# Patient Record
Sex: Female | Born: 1935 | Race: White | Hispanic: No | Smoking: Never smoker
Health system: Southern US, Community
[De-identification: ages and names within clinical notes are randomized; demographics above are authoritative.]

## PROBLEM LIST (undated history)

## (undated) DIAGNOSIS — E785 Hyperlipidemia, unspecified: Secondary | ICD-10-CM

## (undated) DIAGNOSIS — I10 Essential (primary) hypertension: Secondary | ICD-10-CM

## (undated) HISTORY — DX: Essential (primary) hypertension: I10

## (undated) HISTORY — DX: Hyperlipidemia, unspecified: E78.5

---

## 2012-07-31 LAB — CBC AND DIFFERENTIAL
Hemoglobin: 13.8 g/dL (ref 12.0–16.0)
WBC: 4.8 10^3/mL

## 2012-11-13 LAB — HEPATIC FUNCTION PANEL
ALT: 11 U/L (ref 7–35)
AST: 18 U/L (ref 13–35)

## 2012-11-13 LAB — TSH: TSH: 1.06 u[IU]/mL (ref 0.41–5.90)

## 2012-11-13 LAB — CBC AND DIFFERENTIAL
Platelets: 141 10*3/uL — AB (ref 150–399)
WBC: 4.8 10^3/mL

## 2013-03-21 ENCOUNTER — Encounter: Payer: Self-pay | Admitting: Family Medicine

## 2013-03-21 ENCOUNTER — Ambulatory Visit (INDEPENDENT_AMBULATORY_CARE_PROVIDER_SITE_OTHER): Payer: Medicare Other | Admitting: Family Medicine

## 2013-03-21 VITALS — BP 166/67 | HR 66 | Wt 125.0 lb

## 2013-03-21 DIAGNOSIS — F0390 Unspecified dementia without behavioral disturbance: Secondary | ICD-10-CM | POA: Insufficient documentation

## 2013-03-21 DIAGNOSIS — F411 Generalized anxiety disorder: Secondary | ICD-10-CM

## 2013-03-21 DIAGNOSIS — I10 Essential (primary) hypertension: Secondary | ICD-10-CM

## 2013-03-21 DIAGNOSIS — G4700 Insomnia, unspecified: Secondary | ICD-10-CM | POA: Insufficient documentation

## 2013-03-21 DIAGNOSIS — E785 Hyperlipidemia, unspecified: Secondary | ICD-10-CM | POA: Insufficient documentation

## 2013-03-21 MED ORDER — LORAZEPAM 0.5 MG PO TABS: ORAL_TABLET | ORAL | Status: DC

## 2013-03-21 NOTE — Progress Notes (Signed)
CC: Angela Sloan is a 79 y.o. female is here for Establish Care   Subjective: HPI:  Pleasant 79 year old who lives alone establishing care she is leaving her former provider for not returning her phone calls  Patient complains of one month of worsening inability to get a sleep at night. She has no trouble staying asleep she gets 5 hours of sleep a night however will lie in bed for hours until she falls asleep at midnight-1:00 in the morning. She denies racing thoughts she expresses frustration or that she can not get to sleep in his feet on itself the longer she stays awake. She has tried trazodone which caused nightmares, she has been trying over-the-counter sleep aids without any improvement symptoms are described as severe in severity. She does not take naps during the day she stays active during the day with social circles. She's been taking 0.5 mg of Ativan every morning for she believes over a year she's using this for anxiety and believes it is helping. Records that were available at the time of the visit show that she has taken longer acting benzodiazepines in the past I cannot tell when she was actually taking this time of day-wise.  She denies depression nor uncontrolled anxiety, denies mental disturbance or paranoia. She states she has arthritis in her back but is pain-free thanks to acetaminophen when she lies down to go to bed. Other than above nothing makes symptoms better or worse they're present on a daily basis  She's been taking Lipitor for over a year now for hyperlipidemia she's unsure of her last cholesterol check she denies right upper quadrant pain nor myalgias  Reports a history of hypertension has been taking lisinopril 20 mg twice a day no outside blood pressures to report denies any side effects denies cough, angioedema  Reports a history of memory loss has been taking Aricept on a daily basis for at least 3 months without GI disturbance  Review of Systems - General  ROS: negative for - chills, fever, night sweats, weight gain or weight loss Ophthalmic ROS: negative for - decreased vision Psychological ROS: negative for - anxiety or depression ENT ROS: negative for - hearing change, nasal congestion, tinnitus or allergies Hematological and Lymphatic ROS: negative for - bleeding problems, bruising or swollen lymph nodes Breast ROS: negative Respiratory ROS: no cough, shortness of breath, or wheezing Cardiovascular ROS: no chest pain or dyspnea on exertion Gastrointestinal ROS: no abdominal pain, change in bowel habits, or black or bloody stools Genito-Urinary ROS: negative for - genital discharge, genital ulcers, incontinence or abnormal bleeding from genitals Musculoskeletal ROS: negative for - joint pain or muscle pain other than that described above Neurological ROS: negative for - headaches or memory loss Dermatological ROS: negative for lumps, mole changes, rash and skin lesion changes  Past Medical History  Diagnosis Date  . Hypertension   . Hyperlipidemia      History reviewed. No pertinent family history.   History  Substance Use Topics  . Smoking status: Never Smoker   . Smokeless tobacco: Not on file  . Alcohol Use: No     Objective: Filed Vitals:   03/21/13 1118  BP: 166/67  Pulse: 66    General: Alert and Oriented, No Acute Distress HEENT: Pupils equal, round, reactive to light. Conjunctivae clear.  Moist mucous membranes pharynx unremarkable Lungs: Clear to auscultation bilaterally, no wheezing/ronchi/rales.  Comfortable work of breathing. Good air movement. Cardiac: Regular rate and rhythm. Normal S1/S2.  No murmurs, rubs, nor  gallops.   Abdomen: Normal bowel sounds, soft and non tender without palpable masses. Extremities: No peripheral edema.  Strong peripheral pulses.  Mental Status: No depression, anxiety, nor agitation. Skin: Warm and dry.  Assessment & Plan: Angela Sloan was seen today for establish care.  Diagnoses  and associated orders for this visit:  Hyperlipidemia  Dementia  Essential hypertension, benign  Generalized anxiety disorder  Insomnia  Other Orders - LORazepam (ATIVAN) 0.5 MG tablet; One in the morning for anxiety control, take two every evening at bedtime for sleep.    Hyperlipidemia: Requesting outside records to review prior cholesterol panels Dementia: Stable continue Aricept Essential hypertension: Uncontrolled discussed diet and exercise interventions will recheck at followup visit in one week we'll adjust medications as needed Generalized anxiety disorder: Questionably controlled given her trouble sleeping we'll continue a.m. dose of alprazolam Insomnia: Uncontrolled encouraged take 1 mg alprazolam every evening after she has gotten into bed she has tolerated this during the daytime  45 minutes spent face-to-face during visit today of which at least 50% was counseling or coordinating care regarding hyperlipidemia, dementia, essential hypertension, insomnia, generalized anxiety disorder.   Return in about 1 week (around 03/28/2013).

## 2013-03-28 ENCOUNTER — Encounter: Payer: Self-pay | Admitting: Family Medicine

## 2013-03-28 ENCOUNTER — Other Ambulatory Visit: Payer: Self-pay | Admitting: Family Medicine

## 2013-03-28 DIAGNOSIS — E538 Deficiency of other specified B group vitamins: Secondary | ICD-10-CM | POA: Insufficient documentation

## 2013-03-29 ENCOUNTER — Telehealth: Payer: Self-pay | Admitting: Family Medicine

## 2013-03-29 ENCOUNTER — Telehealth: Payer: Self-pay | Admitting: *Deleted

## 2013-03-29 ENCOUNTER — Ambulatory Visit: Payer: Medicare Other | Admitting: Family Medicine

## 2013-03-29 DIAGNOSIS — E785 Hyperlipidemia, unspecified: Secondary | ICD-10-CM

## 2013-03-29 MED ORDER — ATORVASTATIN CALCIUM 20 MG PO TABS: 20.0000 mg | ORAL_TABLET | Freq: Every day | ORAL | Status: DC

## 2013-03-29 NOTE — Telephone Encounter (Signed)
PLEASE CALL L[facility]OR [facility] FOR PATIENT.  SHE IS OUT OF MEDS AFTER TODAY.  SHE HAS APPT Monday.  THANKS

## 2013-03-29 NOTE — Addendum Note (Signed)
Addended by: Laren Boom on: 03/29/2013 11:35 AM   Modules accepted: Orders

## 2013-03-29 NOTE — Telephone Encounter (Signed)
error 

## 2013-04-01 ENCOUNTER — Ambulatory Visit: Payer: Medicare Other | Admitting: Family Medicine

## 2013-04-02 ENCOUNTER — Ambulatory Visit (INDEPENDENT_AMBULATORY_CARE_PROVIDER_SITE_OTHER): Payer: Medicare Other | Admitting: Family Medicine

## 2013-04-02 ENCOUNTER — Encounter: Payer: Self-pay | Admitting: Family Medicine

## 2013-04-02 VITALS — BP 160/69 | HR 73 | Wt 125.0 lb

## 2013-04-02 DIAGNOSIS — Z23 Encounter for immunization: Secondary | ICD-10-CM

## 2013-04-02 DIAGNOSIS — F411 Generalized anxiety disorder: Secondary | ICD-10-CM

## 2013-04-02 DIAGNOSIS — G4700 Insomnia, unspecified: Secondary | ICD-10-CM

## 2013-04-02 DIAGNOSIS — I10 Essential (primary) hypertension: Secondary | ICD-10-CM

## 2013-04-02 MED ORDER — LORAZEPAM 1 MG PO TABS: ORAL_TABLET | ORAL | Status: DC

## 2013-04-02 MED ORDER — SERTRALINE HCL 50 MG PO TABS: 50.0000 mg | ORAL_TABLET | Freq: Every day | ORAL | Status: DC

## 2013-04-02 NOTE — Progress Notes (Signed)
CC: Angela Sloan is a 79 y.o. female is here for 1 week f/u   Subjective: HPI:  Followup generalized anxiety: Patient reports continued subjective anxiety moderate in severity present on a daily basis worse when discussing other family members health issues improved with distractions such as social events at her residence. Symptoms are also slightly improved with her current regimen of alprazolam. She denies being anxious about anything in particular denies paranoia hallucinations or mental disturbance other than above. She denies depression. This has been present for over 3 months.   Followup insomnia: Patient reports slight improvement with trouble falling asleep every night of the week since starting evening dose of alprazolam. She reports waking up 2-3 hours after falling asleep with difficulty falling back asleep in the evening. She denies taking naps in the daytime. Symptoms seem to be worse when daytime anxiety is at its worse. Denies physical discomfort keeping her awake. Denies recent falls or close calls denies memory loss, confirmed by her nephew here today. Her former provider gave her a prescription of amitriptyline which she is wondering if she can start taking she does have a history of glaucoma.  Essential hypertension: She continues on lisinopril 20 mg twice a day. She's taking her blood pressure at home with readings consistently below 140/90. She denies chest pain, shortness of breath, headache, orthopnea, peripheral edema, cough or angioedema     Review Of Systems Outlined In HPI  Past Medical History  Diagnosis Date  . Hypertension   . Hyperlipidemia      Family History  Problem Relation Age of Onset  . Hypertension Brother      History  Substance Use Topics  . Smoking status: Never Smoker   . Smokeless tobacco: Not on file  . Alcohol Use: No     Objective: Filed Vitals:   04/02/13 1127  BP: 160/69  Pulse: 73    General: Alert and Oriented, No Acute  Distress HEENT: Pupils equal, round, reactive to light. Conjunctivae clear.  Moist membranes pharynx unremarkable Lungs: Clear to auscultation bilaterally, no wheezing/ronchi/rales.  Comfortable work of breathing. Good air movement. Cardiac: Regular rate and rhythm. Normal S1/S2.  No murmurs, rubs, nor gallops.   Abdomen: Soft and nontender Extremities: No peripheral edema.  Strong peripheral pulses.  Mental Status: No depression, anxiety, nor agitation. Skin: Warm and dry.  Assessment & Plan: [name] was seen today for 1 week f/u.  Diagnoses and associated orders for this visit:  Essential hypertension, benign  Generalized anxiety disorder - LORazepam (ATIVAN) 1 MG tablet; One in the morning for anxiety control, take two every evening at bedtime for sleep. - sertraline (ZOLOFT) 50 MG tablet; Take 1 tablet (50 mg total) by mouth daily.  Insomnia - LORazepam (ATIVAN) 1 MG tablet; One in the morning for anxiety control, take two every evening at bedtime for sleep. - sertraline (ZOLOFT) 50 MG tablet; Take 1 tablet (50 mg total) by mouth daily.    Essential hypertension: Uncontrolled, discussed dietary and activity interventions to help improve blood pressure control, I've asked her to bring in a detailed blood pressure diary to confirm blood pressures at home are truly in the normotensive/pre-hypertensive range. Insomnia: Uncontrolled, I've advised her against taking amitriptyline. Increasing Ativan regimen discussed fall risk which bothers her less than her discomfort with daily chronic insomnia Generalized anxiety disorder: Start Zoloft, discussed my hopes that this will decrease her subjective need for lorazepam She will receive flu shot today  Return in about 2 weeks (around 04/16/2013).

## 2013-04-12 ENCOUNTER — Telehealth: Payer: Self-pay | Admitting: *Deleted

## 2013-04-12 DIAGNOSIS — G4700 Insomnia, unspecified: Secondary | ICD-10-CM

## 2013-04-12 NOTE — Telephone Encounter (Signed)
Pt called requesting a "sleeping pill." Pt has been having trouble falling asleep at night.Please advise

## 2013-04-15 MED ORDER — RAMELTEON 8 MG PO TABS: 8.0000 mg | ORAL_TABLET | Freq: Every evening | ORAL | Status: DC | PRN

## 2013-04-15 NOTE — Telephone Encounter (Signed)
Left message on vm

## 2013-04-15 NOTE — Telephone Encounter (Signed)
Rx of ramelteon sent to [facility]

## 2013-04-16 ENCOUNTER — Telehealth: Payer: Self-pay | Admitting: *Deleted

## 2013-04-16 NOTE — Telephone Encounter (Signed)
Barrett Shell from [facility] of the [place] called and would like to know if Dr. Ivan Anchors would be ok if they did a preassesment for patient to make sure they have not overlooked anything and  to see if she can qualify for Hospice. I gave them the verbal ok to do a preassesment

## 2013-04-22 ENCOUNTER — Telehealth: Payer: Self-pay | Admitting: Family Medicine

## 2013-04-22 DIAGNOSIS — M25551 Pain in right hip: Secondary | ICD-10-CM

## 2013-04-22 NOTE — Telephone Encounter (Signed)
Opened for Care everywhere

## 2013-04-23 ENCOUNTER — Encounter: Payer: Self-pay | Admitting: Family Medicine

## 2013-04-23 ENCOUNTER — Ambulatory Visit (INDEPENDENT_AMBULATORY_CARE_PROVIDER_SITE_OTHER): Payer: Medicare Other | Admitting: Family Medicine

## 2013-04-23 VITALS — BP 119/60 | HR 66 | Wt 124.0 lb

## 2013-04-23 DIAGNOSIS — M1610 Unilateral primary osteoarthritis, unspecified hip: Secondary | ICD-10-CM

## 2013-04-23 DIAGNOSIS — M1611 Unilateral primary osteoarthritis, right hip: Secondary | ICD-10-CM

## 2013-04-23 DIAGNOSIS — I10 Essential (primary) hypertension: Secondary | ICD-10-CM

## 2013-04-23 DIAGNOSIS — M169 Osteoarthritis of hip, unspecified: Secondary | ICD-10-CM

## 2013-04-23 MED ORDER — OXYCODONE-ACETAMINOPHEN 5-325 MG PO TABS: 1.0000 | ORAL_TABLET | Freq: Four times a day (QID) | ORAL | Status: DC | PRN

## 2013-04-23 MED ORDER — MELOXICAM 7.5 MG PO TABS: 7.5000 mg | ORAL_TABLET | Freq: Every day | ORAL | Status: DC

## 2013-04-23 NOTE — Progress Notes (Signed)
CC: Angela Sloan is a 79 y.o. female is here for Hypertension   Subjective: HPI:  Follow up hypertension: Patient continues to take lisinopril 20 mg twice a day she reports blood pressures at home are consistently below 140/90. She denies lightheadedness, and balance issues, nor falls. Denies chest pain, shortness of breath, cough nor peripheral edema  Followup the emergency room visit: Over the weekend she was seen at [facility] [place] for back pain and right hip pain. X-rays were remarkable for marked periarticular osteophytes of the right with joint space narrowing along with degenerative spondylosis of the lower lumbosacral spine. She was provided a small prescription of oxycodone 5 mg she is taking one of these twice a day and has noticed a substantial improvement of her pain. She states that her quality of life is great soon after taking this medication but pain returns within 8-10 hours. Pain is localized to the right hip anteriorly nonradiating described as a stiffness moderate severity worse after periods of inactivity improves with minutes of movement to a mild degree. She denies bowel or bladder incontinence, nor motor or sensory disturbances in the lower extremities, nor saddle paresthesia   Review Of Systems Outlined In HPI  Past Medical History  Diagnosis Date  . Hypertension   . Hyperlipidemia      Family History  Problem Relation Age of Onset  . Hypertension Brother      History  Substance Use Topics  . Smoking status: Never Smoker   . Smokeless tobacco: Not on file  . Alcohol Use: No     Objective: Filed Vitals:   04/23/13 1310  BP: 119/60  Pulse: 66    General: Alert and Oriented, No Acute Distress HEENT: Pupils equal, round, reactive to light. Conjunctivae clear.  Moist mucous membranes ears unremarkable Lungs: Clear to auscultation bilaterally, no wheezing/ronchi/rales.  Comfortable work of breathing. Good air movement. Cardiac: Regular rate and  rhythm. Normal S1/S2.  No murmurs, rubs, nor gallops.   Abdomen: Soft nontender Extremities: No peripheral edema.  Strong peripheral pulses. Right hip pain reproduced with internal and external rotation of the femur, she has full range of motion and strength in both lower extremities, no lateral tenderness to the hip on the right Mental Status: No depression, anxiety, nor agitation. Skin: Warm and dry.  Assessment & Plan: Mckaylah was seen today for hypertension.  Diagnoses and associated orders for this visit:  Osteoarthritis of right hip - Discontinue: oxyCODONE-acetaminophen (ROXICET) 5-325 MG per tablet; Take 1 tablet by mouth every 6 (six) hours as needed for pain. - meloxicam (MOBIC) 7.5 MG tablet; Take 1 tablet (7.5 mg total) by mouth daily. To prevent inflammation pain from hip and knees. - oxyCODONE-acetaminophen (ROXICET) 5-325 MG per tablet; Take 1 tablet by mouth every 6 (six) hours as needed for pain. May fill on/after October 30th 2014  Essential hypertension, benign     Osteoarthritis right hip: Encouraged her to start meloxicam on a daily basis to help with chronic inflammation and that she may use oxycodone/acetaminophen on an as-needed basis, discussed constipation potential and use of stool softeners if needed Essential hypertension: Controlled continue lisinopril 20 mg twice a day  25 minutes spent face-to-face during visit today of which at least 50% was counseling or coordinating care regarding essential hypertension, osteoarthritis right hip.   Return in about 3 months (around 07/23/2013).

## 2013-04-25 ENCOUNTER — Telehealth: Payer: Self-pay | Admitting: *Deleted

## 2013-04-25 MED ORDER — ONDANSETRON HCL 8 MG PO TABS: 8.0000 mg | ORAL_TABLET | Freq: Three times a day (TID) | ORAL | Status: DC | PRN

## 2013-04-25 NOTE — Telephone Encounter (Signed)
Angela Sloan lm stating that they now think the vomiting is coming from Angela Sloan taking codeine.  He is asking if a different med can be sent without codeine.  Please advise

## 2013-04-25 NOTE — Telephone Encounter (Signed)
Patient's son called saying that the pt is vomiting & having diarrhea. He at first thought it was a med reaction but now his father is experiencing the same sx.  Can we send over something for n/v?

## 2013-04-25 NOTE — Telephone Encounter (Signed)
Pt.notified

## 2013-04-25 NOTE — Telephone Encounter (Signed)
Andrea, Rx placed in in-box ready for pickup/faxing.  

## 2013-04-26 ENCOUNTER — Telehealth: Payer: Self-pay

## 2013-04-26 MED ORDER — TRAMADOL HCL 50 MG PO TABS: 50.0000 mg | ORAL_TABLET | Freq: Three times a day (TID) | ORAL | Status: DC | PRN

## 2013-04-26 NOTE — Telephone Encounter (Signed)
[name] states Angela Sloan is allergic to codeine and would like Tramadol instead. More than 50 mg bid.

## 2013-04-26 NOTE — Telephone Encounter (Signed)
Angela Sloan, Rx placed in in-box ready for pickup/faxing.  

## 2013-04-26 NOTE — Telephone Encounter (Signed)
Pt aware and I faxed the rx to her pharm

## 2013-05-01 ENCOUNTER — Other Ambulatory Visit: Payer: Self-pay | Admitting: Family Medicine

## 2013-05-06 ENCOUNTER — Other Ambulatory Visit: Payer: Self-pay | Admitting: Family Medicine

## 2013-05-07 ENCOUNTER — Other Ambulatory Visit: Payer: Self-pay | Admitting: *Deleted

## 2013-05-07 DIAGNOSIS — G4700 Insomnia, unspecified: Secondary | ICD-10-CM

## 2013-05-07 DIAGNOSIS — F411 Generalized anxiety disorder: Secondary | ICD-10-CM

## 2013-05-07 MED ORDER — LORAZEPAM 1 MG PO TABS: ORAL_TABLET | ORAL | Status: DC

## 2013-05-08 ENCOUNTER — Encounter: Payer: Self-pay | Admitting: *Deleted

## 2013-05-08 LAB — CALCIUM: Calcium: 8.7 mg/dL

## 2013-05-08 LAB — VITAMIN B12: Vitamin B12 Deficiency Panel: >2000

## 2013-05-17 ENCOUNTER — Encounter: Payer: Self-pay | Admitting: Family Medicine

## 2013-05-17 DIAGNOSIS — Z299 Encounter for prophylactic measures, unspecified: Secondary | ICD-10-CM | POA: Insufficient documentation

## 2013-05-27 ENCOUNTER — Other Ambulatory Visit: Payer: Self-pay | Admitting: *Deleted

## 2013-05-27 MED ORDER — TRAMADOL HCL 50 MG PO TABS: 50.0000 mg | ORAL_TABLET | Freq: Three times a day (TID) | ORAL | Status: DC | PRN

## 2013-05-28 ENCOUNTER — Other Ambulatory Visit: Payer: Self-pay | Admitting: Family Medicine

## 2013-05-31 ENCOUNTER — Other Ambulatory Visit: Payer: Self-pay | Admitting: Family Medicine

## 2013-06-04 ENCOUNTER — Telehealth: Payer: Self-pay | Admitting: Family Medicine

## 2013-06-04 MED ORDER — TRAZODONE HCL 50 MG PO TABS: 50.0000 mg | ORAL_TABLET | Freq: Every evening | ORAL | Status: DC | PRN

## 2013-06-04 MED ORDER — LACRISERT 5 MG OP INST: VAGINAL_INSERT | OPHTHALMIC | Status: DC

## 2013-06-04 MED ORDER — FEXOFENADINE HCL 180 MG PO TABS: 180.0000 mg | ORAL_TABLET | Freq: Every day | ORAL | Status: DC

## 2013-06-04 NOTE — Telephone Encounter (Signed)
Faxed rx to [phone] @ [facility] [place]

## 2013-06-04 NOTE — Telephone Encounter (Signed)
Per request of [facility], Rx placed in in-box ready for pickup/faxing.

## 2013-06-25 ENCOUNTER — Other Ambulatory Visit: Payer: Self-pay | Admitting: *Deleted

## 2013-06-25 ENCOUNTER — Telehealth: Payer: Self-pay | Admitting: Family Medicine

## 2013-06-25 DIAGNOSIS — F411 Generalized anxiety disorder: Secondary | ICD-10-CM

## 2013-06-25 DIAGNOSIS — G4700 Insomnia, unspecified: Secondary | ICD-10-CM

## 2013-06-25 MED ORDER — LORAZEPAM 1 MG PO TABS: ORAL_TABLET | ORAL | Status: DC

## 2013-06-25 NOTE — Telephone Encounter (Signed)
Brother came by.  His sister needs a  new script for Lorazepam. Can you fill asap.

## 2013-06-25 NOTE — Telephone Encounter (Signed)
rx in Dr. Genelle Bal box to sign

## 2013-07-01 ENCOUNTER — Other Ambulatory Visit: Payer: Self-pay | Admitting: Family Medicine

## 2013-07-23 ENCOUNTER — Other Ambulatory Visit: Payer: Self-pay | Admitting: Family Medicine

## 2013-07-23 ENCOUNTER — Encounter: Payer: Self-pay | Admitting: Family Medicine

## 2013-07-23 ENCOUNTER — Ambulatory Visit (INDEPENDENT_AMBULATORY_CARE_PROVIDER_SITE_OTHER): Payer: Medicare Other | Admitting: Family Medicine

## 2013-07-23 VITALS — BP 216/93 | HR 82 | Wt 116.0 lb

## 2013-07-23 DIAGNOSIS — I10 Essential (primary) hypertension: Secondary | ICD-10-CM

## 2013-07-23 DIAGNOSIS — E785 Hyperlipidemia, unspecified: Secondary | ICD-10-CM

## 2013-07-23 DIAGNOSIS — M169 Osteoarthritis of hip, unspecified: Secondary | ICD-10-CM

## 2013-07-23 DIAGNOSIS — G4700 Insomnia, unspecified: Secondary | ICD-10-CM

## 2013-07-23 DIAGNOSIS — M1611 Unilateral primary osteoarthritis, right hip: Secondary | ICD-10-CM

## 2013-07-23 DIAGNOSIS — F411 Generalized anxiety disorder: Secondary | ICD-10-CM

## 2013-07-23 MED ORDER — HYDROCHLOROTHIAZIDE 25 MG PO TABS: 25.0000 mg | ORAL_TABLET | Freq: Every day | ORAL | Status: DC

## 2013-07-23 MED ORDER — TRAMADOL HCL 50 MG PO TABS: ORAL_TABLET | ORAL | Status: DC

## 2013-07-23 MED ORDER — LORAZEPAM 1 MG PO TABS: ORAL_TABLET | ORAL | Status: DC

## 2013-07-23 MED ORDER — DONEPEZIL HCL 5 MG PO TABS: 5.0000 mg | ORAL_TABLET | Freq: Every day | ORAL | Status: DC

## 2013-07-23 MED ORDER — SERTRALINE HCL 50 MG PO TABS: ORAL_TABLET | ORAL | Status: DC

## 2013-07-23 MED ORDER — ATORVASTATIN CALCIUM 20 MG PO TABS: 20.0000 mg | ORAL_TABLET | Freq: Every day | ORAL | Status: DC

## 2013-07-23 MED ORDER — LISINOPRIL 20 MG PO TABS: ORAL_TABLET | ORAL | Status: DC

## 2013-07-23 NOTE — Progress Notes (Signed)
CC: Angela Sloan is a 79 y.o. female is here for Hypertension   Subjective: HPI:  Patient presents for followup there is quite a confusion on what medication she is taking should be taking after she left [facility] after leaving for reasons the family doesn't seem to want to go into today.  The physician who discharged her had made multiple changes to her medication list but the family is unsure why this was done  Followup essential hypertension: Hydrochlorothiazide was stopped while she was in assisted living due to concerns of this causing her dry eye. She reports stopping this medication did not help her dry eyes whatsoever. Blood pressures recorded on a daily basis show gradual increase of blood pressure to her current stage II hypertensive state gradually since the beginning of December. Patient denies motor or sensory disturbances, chest pain, shortness of breath, orthopnea, peripheral edema continues on lisinopril twice a day  Followup insomnia: Patient was prescribed Rozerem but it sounds like this was never filled. She reports difficulty getting asleep at night due to pain in her right hip which is described as chronic, moderate in severity, worse with inactivity and nonradiating. Continues to take Ativan 1-2 mg every evening with mild improvement of staying asleep and getting asleep.  Generalized anxiety disorder: Continues on Zoloft however this was lowered down, only taking 25 mg on a nightly basis she is unsure why this was changed but denies any side effects. Reports that generalized anxiety is mild to moderate in severity on a daily basis described as restlessness and worrying about nothing in particular. Was much better on a higher dose of Zoloft per her report  Osteoarthritis of the right hip: She reports pain was significantly improved while taking tramadol however this was stopped for reasons she is unsure about. She has tried oxycodone and hydrocodone and codeine all of which  cause nausea.  Denies any change in the character of her right hip pain however has declined from mild to moderate in severity since stopping tramadol  Hyperlipidemia: Requesting refill on Lipitor denies right upper quadrant pain or myalgias   Review Of Systems Outlined In HPI  Past Medical History  Diagnosis Date  . Hypertension   . Hyperlipidemia      Family History  Problem Relation Age of Onset  . Hypertension Brother      History  Substance Use Topics  . Smoking status: Never Smoker   . Smokeless tobacco: Not on file  . Alcohol Use: No     Objective: Filed Vitals:   07/23/13 1328  BP: 216/93  Pulse: 82    General: Alert and Oriented, No Acute Distress HEENT: Pupils equal, round, reactive to light. Conjunctivae clear.  Moist mucous membranes pharynx unremarkable Lungs: Clear to auscultation bilaterally, no wheezing/ronchi/rales.  Comfortable work of breathing. Good air movement. Cardiac: Regular rate and rhythm. Normal S1/S2.  No murmurs, rubs, nor gallops.   Abdomen: Soft nontender Extremities: No peripheral edema.  Strong peripheral pulses.  Mental Status: No depression, anxiety, nor agitation. Skin: Warm and dry.  Assessment & Plan: [name] was seen today for hypertension.  Diagnoses and associated orders for this visit:  Essential hypertension, benign  Insomnia - LORazepam (ATIVAN) 1 MG tablet; One in the morning for anxiety control, take two every evening at bedtime for sleep.  Generalized anxiety disorder - LORazepam (ATIVAN) 1 MG tablet; One in the morning for anxiety control, take two every evening at bedtime for sleep.  Osteoarthritis of right hip  Hyperlipidemia -  atorvastatin (LIPITOR) 20 MG tablet; Take 1 tablet (20 mg total) by mouth daily. For cholesterol  Other Orders - donepezil (ARICEPT) 5 MG tablet; Take 1 tablet (5 mg total) by mouth at bedtime. To help prevent memory loss. - hydrochlorothiazide (HYDRODIURIL) 25 MG tablet; Take 1  tablet (25 mg total) by mouth daily. For blood pressure. - lisinopril (PRINIVIL,ZESTRIL) 20 MG tablet; One by mouth every morning and every evening for blood pressure. - sertraline (ZOLOFT) 50 MG tablet; TAKE 1 TABLET ONCE DAILY to help prevent anxiety - traMADol (ULTRAM) 50 MG tablet; One to one & half tabs by mouth every 8 hours only as needed for pain.    Essential hypertension: Uncontrolled restart hydrochlorothiazide keep blood pressure diary and please provide a copy of this next week to see if we need to increase up to 50 mg daily Insomnia: Uncontrolled, continue lorazepam will not increase this dosage as I feel the pain is most likely to be mostly to her insomnia. Restart tramadol below Osteoarthritis of the right hip: Restart tramadol Generalized anxiety disorder: Uncontrolled, Return to 50 mg of Zoloft on a daily basis Hyperlipidemia: Continue Lipitor return fasting in one month for general followup for lipid panel  40 minutes spent face-to-face during visit today of which at least 50% was counseling or coordinating care regarding essential hypertension, insomnia, generalized anxiety, osteoarthritis, hyperlipidemia.    Return in about 4 weeks (around 08/20/2013) for BP Check.

## 2013-07-26 ENCOUNTER — Other Ambulatory Visit: Payer: Self-pay | Admitting: Family Medicine

## 2013-08-20 ENCOUNTER — Ambulatory Visit (INDEPENDENT_AMBULATORY_CARE_PROVIDER_SITE_OTHER): Payer: Medicare Other | Admitting: Family Medicine

## 2013-08-20 ENCOUNTER — Encounter: Payer: Self-pay | Admitting: Family Medicine

## 2013-08-20 VITALS — BP 164/77 | HR 71 | Wt 113.0 lb

## 2013-08-20 DIAGNOSIS — M1610 Unilateral primary osteoarthritis, unspecified hip: Secondary | ICD-10-CM

## 2013-08-20 DIAGNOSIS — M169 Osteoarthritis of hip, unspecified: Secondary | ICD-10-CM

## 2013-08-20 DIAGNOSIS — I10 Essential (primary) hypertension: Secondary | ICD-10-CM

## 2013-08-20 DIAGNOSIS — E785 Hyperlipidemia, unspecified: Secondary | ICD-10-CM

## 2013-08-20 DIAGNOSIS — M1611 Unilateral primary osteoarthritis, right hip: Secondary | ICD-10-CM

## 2013-08-20 MED ORDER — AMLODIPINE BESYLATE 5 MG PO TABS: 5.0000 mg | ORAL_TABLET | Freq: Every day | ORAL | Status: DC

## 2013-08-20 MED ORDER — TRAMADOL HCL 50 MG PO TABS: ORAL_TABLET | ORAL | Status: DC

## 2013-08-20 NOTE — Progress Notes (Signed)
CC: Angela Sloan is a 80 y.o. female is here for Hypertension   Subjective: HPI:  Followup hypertension: Continues on lisinopril 20 mg twice a day, she was taking hydrochlorothiazide 25 mg daily however was having uncontrollable urinary frequency and urgency so she cut down to only a quarter of this on a daily basis with complete resolution of her urinary symptoms. She brings in blood pressures over the past week all of which are in high stage I hypertension. She denies chest pain shortness of breath orthopnea peripheral edema, motor sensory disturbances. Denies lightheadedness or falls since I saw her last  Followup osteoarthritis right hip: States that since taking one half tabs of Ultram twice a day symptoms are much better controlled however only for 6 hours after taking the medication then pain returns. Is described as a deep ache moderate in severity worse with movement after sitting for long periods of time.  Denies any new. Her or severity to her pain.  Followup hyperlipidemia: Continues on Lipitor without right upper quadrant pain nor myalgias. She is not fasting today   Review Of Systems Outlined In HPI  Past Medical History  Diagnosis Date  . Hypertension   . Hyperlipidemia      Family History  Problem Relation Age of Onset  . Hypertension Brother      History  Substance Use Topics  . Smoking status: Never Smoker   . Smokeless tobacco: Not on file  . Alcohol Use: No     Objective: Filed Vitals:   08/20/13 1408  BP: 164/77  Pulse: 71    General: Alert and Oriented, No Acute Distress HEENT: Pupils equal, round, reactive to light. Conjunctivae clear.  Moist mucous membranes pharynx unremarkable Lungs: Clear to auscultation bilaterally, no wheezing/ronchi/rales.  Comfortable work of breathing. Good air movement. Cardiac: Regular rate and rhythm. Normal S1/S2.  No murmurs, rubs, nor gallops.   Extremities: No peripheral edema.  Strong peripheral pulses. Full  range of motion strength of the right lower extremity without pain Mental Status: No depression, anxiety, nor agitation. Skin: Warm and dry.  Assessment & Plan: [name] was seen today for hypertension.  Diagnoses and associated orders for this visit:  Essential hypertension, benign - amLODipine (NORVASC) 5 MG tablet; Take 1 tablet (5 mg total) by mouth daily.  Hyperlipidemia  Osteoarthritis of right hip - traMADol (ULTRAM) 50 MG tablet; One to two tabs by mouth every 8 hours only as needed for pain.    Essential hypertension: Controlled chronic condition stop hydrochlorothiazide entirely continue lisinopril and now start amlodipine bring in blood pressure journal to review in 2-4 weeks Hyperlipidemia: Since she is not fasting we will wait until she can followup in a fasting state at her followup visit Osteoarthritis right hip: Continue tramadol she was advised that she can take up to 2 of these by mouth 3 times a day if needed for complete control of her pain.  Return in about 4 weeks (around 09/17/2013).

## 2013-08-20 NOTE — Patient Instructions (Signed)
Stop (water pill) hydrochlorothiazide, continue Lisinopril and now adding amlodipine all to help control blood pressure.

## 2013-08-27 ENCOUNTER — Other Ambulatory Visit: Payer: Self-pay | Admitting: Family Medicine

## 2013-08-29 ENCOUNTER — Telehealth: Payer: Self-pay | Admitting: *Deleted

## 2013-08-29 NOTE — Telephone Encounter (Signed)
Pt calling and states both legs are swollen. Pt denies any pain in the legs or anywhere else. Unfortunately we dont have any available appts tomorrow for this pt so after speaking with Dr. Ivan AnchorsHommel pt can wait until Monday or she can [facility] to [facility] if she feels the need to be seen sooner. Tried to call pt back twice and the line was busy both times

## 2013-09-04 ENCOUNTER — Other Ambulatory Visit: Payer: Self-pay | Admitting: Family Medicine

## 2013-09-05 ENCOUNTER — Telehealth: Payer: Self-pay | Admitting: Family Medicine

## 2013-09-05 NOTE — Telephone Encounter (Signed)
Denied refill request from [facility] as it was recently filled.

## 2013-09-24 ENCOUNTER — Other Ambulatory Visit: Payer: Self-pay | Admitting: *Deleted

## 2013-09-24 ENCOUNTER — Other Ambulatory Visit: Payer: Self-pay | Admitting: Family Medicine

## 2013-09-24 MED ORDER — DONEPEZIL HCL 5 MG PO TABS: 5.0000 mg | ORAL_TABLET | Freq: Every day | ORAL | Status: DC

## 2013-09-30 ENCOUNTER — Other Ambulatory Visit: Payer: Self-pay | Admitting: Family Medicine

## 2013-10-01 ENCOUNTER — Other Ambulatory Visit: Payer: Self-pay | Admitting: Family Medicine

## 2013-10-04 ENCOUNTER — Encounter: Payer: Self-pay | Admitting: Family Medicine

## 2013-10-04 ENCOUNTER — Telehealth: Payer: Self-pay | Admitting: *Deleted

## 2013-10-04 ENCOUNTER — Ambulatory Visit (INDEPENDENT_AMBULATORY_CARE_PROVIDER_SITE_OTHER): Payer: Medicare Other | Admitting: Family Medicine

## 2013-10-04 VITALS — BP 177/81 | HR 88 | Wt 106.0 lb

## 2013-10-04 DIAGNOSIS — M542 Cervicalgia: Secondary | ICD-10-CM

## 2013-10-04 DIAGNOSIS — F419 Anxiety disorder, unspecified: Secondary | ICD-10-CM

## 2013-10-04 DIAGNOSIS — F0390 Unspecified dementia without behavioral disturbance: Secondary | ICD-10-CM

## 2013-10-04 DIAGNOSIS — F411 Generalized anxiety disorder: Secondary | ICD-10-CM

## 2013-10-04 MED ORDER — CYCLOBENZAPRINE HCL 5 MG PO TABS: 2.5000 mg | ORAL_TABLET | Freq: Every evening | ORAL | Status: DC | PRN

## 2013-10-04 MED ORDER — SERTRALINE HCL 100 MG PO TABS: ORAL_TABLET | ORAL | Status: DC

## 2013-10-04 MED ORDER — AMBULATORY NON FORMULARY MEDICATION: Status: DC

## 2013-10-04 NOTE — Progress Notes (Signed)
CC: Angela Sloan is a 80 y.o. female is here for Neck Pain   Subjective: HPI:  Patient complains of neck pain this is described as stiffness moderate in severity that is present on a daily basis for the past year it has been more bothersome over the past 2 weeks. It is at the base of the skull nonradiating. It is worse when she gets nervous or anxious. He is described only as stiffness. Nothing particularly makes it better. It can occur any hours of the day.  She denies any recent or remote accident or trauma to the area pain.  She denies pain in the shoulders nor upper extremity. Denies motor or sensory disturbances in the upper extremities.  Denies difficulty swallowing. Mobic and tramadol do not seem to help.  Patient claims that her anxiety is worsening since I saw her last. Is described as a nervousness and butterflies in her stomach that occurs whenever she is around more than one person.  It often lasts for hours and will interfere with her sleep. She denies worrying about any particular event other than large groups of people. Denies depression or mental disturbance other than above.  She carries a diagnosis of dementia and has had her nephew managing her finances. She tells me today that she trusts her nephew Angela Sloan With her finances and that she believes he is looking out for her best interest. She would like for him to continue managing her finances and social security benefits. She's asking me if I can write a letter to [employer] stating this.   Review Of Systems Outlined In HPI  Past Medical History  Diagnosis Date  . Hypertension   . Hyperlipidemia     No past surgical history on file. Family History  Problem Relation Age of Onset  . Hypertension Brother     History   Social History  . Marital Status: Widowed    Spouse Name: N/A    Number of Children: N/A  . Years of Education: N/A   Occupational History  . Not on file.   Social History Main  Topics  . Smoking status: Never Smoker   . Smokeless tobacco: Not on file  . Alcohol Use: No  . Drug Use: No  . Sexual Activity: No   Other Topics Concern  . Not on file   Social History Narrative  . No narrative on file     Objective: BP 177/81  Pulse 88  Wt 106 lb (48.081 kg)  General: Alert and Oriented, No Acute Distress HEENT: Pupils equal, round, reactive to light. Conjunctivae clear.   moist mucous membranes pharynx unremarkable. No midline spinous process tenderness in the cervical spine with palpation pain is reproduced with palpation of paraspinal musculature especially directly below the occiput.full range of motion and strength of the C-spine with Spurling's negative bilaterally  Lungs: Clear to auscultation bilaterally, no wheezing/ronchi/rales.  Comfortable work of breathing. Good air movement. Cardiac: Regular rate and rhythm. Normal S1/S2.  No murmurs, rubs, nor gallops.   Extremities:  full range of motion strength in both upper extremities  Mental Status: No depression, anxiety, nor agitation. Skin: Warm and dry.  Assessment & Plan: Angela Sloan was seen today for neck pain.  Diagnoses and associated orders for this visit:  Neck pain - cyclobenzaprine (FLEXERIL) 5 MG tablet; Take 0.5 tablets (2.5 mg total) by mouth at bedtime as needed (Neck Pain).  Anxiety - sertraline (ZOLOFT) 100 MG tablet; TAKE 1 TABLET ONCE DAILY to help prevent  anxiety  Dementia  Other Orders - AMBULATORY NON FORMULARY MEDICATION; Hospice evaluation.  Dx: Dementia    Neck pain: I believe this is coming from muscular tension, she is not getting any benefit from anti-inflammatories and per her report has tolerated muscle relaxers in the past without sedation or balance issues. We'll start extremely small dose of cyclobenzaprine with sedation warning discussed Anxiety: Uncontrolled increase in Zoloft Dementia: Stable was written reflecting her desire to have her nephew continuing to  manage her finances.  Return in about 4 weeks (around 11/01/2013).

## 2013-10-04 NOTE — Telephone Encounter (Signed)
Faxed order to [facility] along with office notes and insurance card. Spoke with someone there and pt may very well qualify for hospice care now since she has lost about 6 lbs since her last visit. So you will be the attending physician for this patient meaning they will send order to you to sign and keep you updated with reports of whats going on with her. They will have physicians there who will do her symptom care and they will be the ones who will be consulted if problems arise when you during your non business hours ([phone] fax #)

## 2013-10-10 ENCOUNTER — Telehealth: Payer: Self-pay | Admitting: *Deleted

## 2013-10-10 NOTE — Telephone Encounter (Signed)
FYI: [facility]

## 2013-10-22 ENCOUNTER — Encounter: Payer: Self-pay | Admitting: Family Medicine

## 2013-10-22 ENCOUNTER — Ambulatory Visit (INDEPENDENT_AMBULATORY_CARE_PROVIDER_SITE_OTHER)

## 2013-10-22 ENCOUNTER — Ambulatory Visit (INDEPENDENT_AMBULATORY_CARE_PROVIDER_SITE_OTHER): Payer: Medicare Other | Admitting: Family Medicine

## 2013-10-22 VITALS — BP 195/88 | HR 75 | Temp 99.1°F | Wt 107.0 lb

## 2013-10-22 DIAGNOSIS — R110 Nausea: Secondary | ICD-10-CM

## 2013-10-22 DIAGNOSIS — R143 Flatulence: Secondary | ICD-10-CM

## 2013-10-22 DIAGNOSIS — M542 Cervicalgia: Secondary | ICD-10-CM

## 2013-10-22 DIAGNOSIS — F411 Generalized anxiety disorder: Secondary | ICD-10-CM

## 2013-10-22 DIAGNOSIS — R142 Eructation: Secondary | ICD-10-CM

## 2013-10-22 DIAGNOSIS — R1012 Left upper quadrant pain: Secondary | ICD-10-CM

## 2013-10-22 DIAGNOSIS — R141 Gas pain: Secondary | ICD-10-CM

## 2013-10-22 DIAGNOSIS — R911 Solitary pulmonary nodule: Secondary | ICD-10-CM

## 2013-10-22 DIAGNOSIS — F419 Anxiety disorder, unspecified: Secondary | ICD-10-CM

## 2013-10-22 LAB — CBC WITH DIFFERENTIAL/PLATELET
BASOS ABS: 0 10*3/uL (ref 0.0–0.1)
BASOS PCT: 0 % (ref 0–1)
EOS ABS: 0.1 10*3/uL (ref 0.0–0.7)
Eosinophils Relative: 2 % (ref 0–5)
HCT: 43.8 % (ref 36.0–46.0)
HEMOGLOBIN: 14.6 g/dL (ref 12.0–15.0)
Lymphocytes Relative: 18 % (ref 12–46)
Lymphs Abs: 1 10*3/uL (ref 0.7–4.0)
MCH: 30.9 pg (ref 26.0–34.0)
MCHC: 33.3 g/dL (ref 30.0–36.0)
MCV: 92.6 fL (ref 78.0–100.0)
MONOS PCT: 10 % (ref 3–12)
Monocytes Absolute: 0.6 10*3/uL (ref 0.1–1.0)
NEUTROS PCT: 70 % (ref 43–77)
Neutro Abs: 4 10*3/uL (ref 1.7–7.7)
PLATELETS: 151 10*3/uL (ref 150–400)
RBC: 4.73 MIL/uL (ref 3.87–5.11)
RDW: 15.2 % (ref 11.5–15.5)
WBC: 5.7 10*3/uL (ref 4.0–10.5)

## 2013-10-22 MED ORDER — CYCLOBENZAPRINE HCL 5 MG PO TABS
5.0000 mg | ORAL_TABLET | Freq: Every evening | ORAL | Status: DC | PRN
Start: 2013-10-22 — End: 2013-12-20

## 2013-10-22 MED ORDER — SERTRALINE HCL 100 MG PO TABS: ORAL_TABLET | ORAL | Status: DC

## 2013-10-22 MED ORDER — ONDANSETRON HCL 8 MG PO TABS: 8.0000 mg | ORAL_TABLET | Freq: Three times a day (TID) | ORAL | Status: DC | PRN

## 2013-10-22 NOTE — Progress Notes (Signed)
CC: Angela Sloan is a 80 y.o. female is here for Nausea   Subjective: HPI:  Patient complains of nausea that began yesterday following one day of diarrhea which is now resolved. Nausea is described as moderate in severity she is tolerating Gatorade not trying to eat or drink anything else. She has not vomited during this ordeal. Symptoms came on gradually nothing particularly makes better or worse. They're present all hours of the day there been no interventions as of yet. She continues to pass gas and denies any melena or blood in stool. Denies fevers, chills, chest pain, shortness of breath, diaphoresis, pelvic pain. She does endorse abdominal pain but part of her described.  Complaints of continued posterior neck pain but has not responded to 2.5 mg of cyclobenzaprine at night. She states that the medication absolutely does not cause her any dizziness, confusion, nor balance issues.  She has run out of her Zoloft maturely as she was taking this twice a day by accident.   Review Of Systems Outlined In HPI  Past Medical History  Diagnosis Date  . Hypertension   . Hyperlipidemia     No past surgical history on file. Family History  Problem Relation Age of Onset  . Hypertension Brother     History   Social History  . Marital Status: Widowed    Spouse Name: N/A    Number of Children: N/A  . Years of Education: N/A   Occupational History  . Not on file.   Social History Main Topics  . Smoking status: Never Smoker   . Smokeless tobacco: Not on file  . Alcohol Use: No  . Drug Use: No  . Sexual Activity: No   Other Topics Concern  . Not on file   Social History Narrative  . No narrative on file     Objective: BP 195/88  Pulse 75  Temp(Src) 99.1 F (37.3 C) (Oral)  Wt 107 lb (48.535 kg)  General: Alert and Oriented, No Acute Distress HEENT: Pupils equal, round, reactive to light. Conjunctivae clear.  Moist mucous membranes pharynx unremarkable Lungs: Clear to  auscultation bilaterally, no wheezing/ronchi/rales.  Comfortable work of breathing. Good air movement. Cardiac: Regular rate and rhythm. Normal S1/S2.  No murmurs, rubs, nor gallops.   Abdomen: Normal bowel sounds without guarding nor rigidity, mild left upper quadrant tenderness to palpation reproducing her subjective abdominal pain without rebound. Extremities: No peripheral edema.  Strong peripheral pulses.  Mental Status: No depression, anxiety, nor agitation. Skin: Warm and dry.  Assessment & Plan: Angela Sloan was seen today for nausea.  Diagnoses and associated orders for this visit:  Nausea alone - COMPLETE METABOLIC PANEL WITH GFR - CBC w/Diff - DG Abd Acute W/Chest; Future - PR ELECTROCARDIOGRAM, COMPLETE - ondansetron (ZOFRAN) 8 MG tablet; Take 1 tablet (8 mg total) by mouth every 8 (eight) hours as needed for nausea or vomiting.  Abdominal pain, left upper quadrant - COMPLETE METABOLIC PANEL WITH GFR - CBC w/Diff - DG Abd Acute W/Chest; Future - PR ELECTROCARDIOGRAM, COMPLETE  Neck pain - cyclobenzaprine (FLEXERIL) 5 MG tablet; Take 1 tablet (5 mg total) by mouth at bedtime as needed (Neck Pain).  Anxiety - sertraline (ZOLOFT) 100 MG tablet; TAKE 1 TABLET ONCE DAILY to help prevent anxiety    Nausea with abdominal pain: In office she was given 8 mg of ondansetron and had a drastic improvement with her nausea. While we were waiting for medication to take effect an abdominal x-ray was obtained which ruled  out obstruction without any intra-abdominal abnormality. Checking CBC with differential and metabolic panel, pending these results continue again to try and 8 mg as needed for nausea. EKG was obtained to rule out ACS showing normal sinus rhythm normal axis normal intervals, no pathologic Q waves, no ST segment elevation or depression no abdominal T waves Neck pain: Uncontrolled increase her Flexeril, side effect warning was stressed Anxiety: Sending a new refill of Zoloft to  her pharmacy since she ran out of medication prematurely, recommend she only take 100 mg once a day  Return in about 4 weeks (around 11/19/2013).

## 2013-10-23 LAB — COMPLETE METABOLIC PANEL WITH GFR
ALK PHOS: 67 U/L (ref 39–117)
ALT: <8 U/L (ref 0–35)
AST: 13 U/L (ref 0–37)
Albumin: 3.7 g/dL (ref 3.5–5.2)
BILIRUBIN TOTAL: 0.4 mg/dL (ref 0.2–1.2)
BUN: 15 mg/dL (ref 6–23)
CO2: 29 mEq/L (ref 19–32)
CREATININE: 0.67 mg/dL (ref 0.50–1.10)
Calcium: 8.7 mg/dL (ref 8.4–10.5)
Chloride: 101 mEq/L (ref 96–112)
GFR, EST AFRICAN AMERICAN: 85 mL/min
GFR, Est Non African American: 74 mL/min
GLUCOSE: 103 mg/dL — AB (ref 70–99)
Potassium: 3.7 mEq/L (ref 3.5–5.3)
SODIUM: 138 meq/L (ref 135–145)
TOTAL PROTEIN: 6 g/dL (ref 6.0–8.3)

## 2013-10-24 ENCOUNTER — Encounter: Payer: Self-pay | Admitting: *Deleted

## 2013-11-04 ENCOUNTER — Telehealth: Payer: Self-pay | Admitting: Family Medicine

## 2013-11-04 ENCOUNTER — Telehealth: Payer: Self-pay | Admitting: *Deleted

## 2013-11-04 DIAGNOSIS — R911 Solitary pulmonary nodule: Secondary | ICD-10-CM

## 2013-11-04 NOTE — Telephone Encounter (Signed)
Prior authorization obtained for CT Chest w/o contrast [facility].  Auth# [id number] valid from 11/04/13 to 12/03/13.  [facility] notified. Barry DienesKimberly Stephanos Fan, LPN

## 2013-11-04 NOTE — Telephone Encounter (Signed)
Called home # and no answer, called emergency contact Dorene SorrowJerry, her nephew and he was notified. ( he is the one that brings pt to her appts)

## 2013-11-04 NOTE — Telephone Encounter (Signed)
Angela Sloan, Will you please let patient or her nephew know that when the radiologist looked at her xrays from the end of March there was a nodule in her right lower lung.  He has recommended a CT scan of the chest to get a 3-D picture of this, I've placed an order for this study to be done here at [facility].  This finding was not what was causing her illness, it's just a standard follow up for these findings.

## 2013-11-06 ENCOUNTER — Other Ambulatory Visit: Payer: Self-pay | Admitting: Family Medicine

## 2013-11-18 ENCOUNTER — Other Ambulatory Visit: Payer: Self-pay | Admitting: Family Medicine

## 2013-11-19 ENCOUNTER — Ambulatory Visit (INDEPENDENT_AMBULATORY_CARE_PROVIDER_SITE_OTHER): Payer: Medicare Other

## 2013-11-19 ENCOUNTER — Encounter: Payer: Self-pay | Admitting: Family Medicine

## 2013-11-19 ENCOUNTER — Ambulatory Visit (INDEPENDENT_AMBULATORY_CARE_PROVIDER_SITE_OTHER): Payer: Medicare Other | Admitting: Family Medicine

## 2013-11-19 VITALS — BP 215/99 | HR 88 | Wt 103.0 lb

## 2013-11-19 DIAGNOSIS — R911 Solitary pulmonary nodule: Secondary | ICD-10-CM

## 2013-11-19 DIAGNOSIS — I2510 Atherosclerotic heart disease of native coronary artery without angina pectoris: Secondary | ICD-10-CM

## 2013-11-19 DIAGNOSIS — I517 Cardiomegaly: Secondary | ICD-10-CM

## 2013-11-19 DIAGNOSIS — M4310 Spondylolisthesis, site unspecified: Secondary | ICD-10-CM

## 2013-11-19 DIAGNOSIS — G4700 Insomnia, unspecified: Secondary | ICD-10-CM

## 2013-11-19 DIAGNOSIS — M542 Cervicalgia: Secondary | ICD-10-CM

## 2013-11-19 DIAGNOSIS — F411 Generalized anxiety disorder: Secondary | ICD-10-CM

## 2013-11-19 DIAGNOSIS — I10 Essential (primary) hypertension: Secondary | ICD-10-CM

## 2013-11-19 DIAGNOSIS — I700 Atherosclerosis of aorta: Secondary | ICD-10-CM

## 2013-11-19 DIAGNOSIS — M5030 Other cervical disc degeneration, unspecified cervical region: Secondary | ICD-10-CM

## 2013-11-19 MED ORDER — AMLODIPINE BESYLATE 10 MG PO TABS: 10.0000 mg | ORAL_TABLET | Freq: Every day | ORAL | Status: DC

## 2013-11-19 NOTE — Progress Notes (Signed)
CC: Angela Sloan is a 80 y.o. female is here for 1 month f/u anxiety   Subjective: HPI:  Followup lung nodule: CT scan order is active however they have not scheduled a time to have this performed. There has been no blood in sputum, cough, shortness of breath, nor any pulmonary complaints since I saw patient last.  Followup anxiety: Continues to take Zoloft 100 mg a daily basis without noted side effects. She states her anxiety and insomnia has improved since spring began currently is mild in severity and not interfering quality of life.   followup essential hypertension: Continues on lisinopril and amlodipine without missed doses are known side effects. No outside blood pressures to report. Denies chest pain, shortness of breath, orthopnea, peripheral edema nor motor or sensory disturbances.  Followup neck pain: Continues to have chronic neck pain localized at occiput it is nonradiating. Slightly improved with Ultram, nothing else makes better or worse. It's present all hours of the day. Denies upper extremity weakness or radiation of pain. Denies difficulty swallowing, fevers, chills, nor back pain elsewhere    Review Of Systems Outlined In HPI  Past Medical History  Diagnosis Date  . Hypertension   . Hyperlipidemia     No past surgical history on file. Family History  Problem Relation Age of Onset  . Hypertension Brother     History   Social History  . Marital Status: Widowed    Spouse Name: N/A    Number of Children: N/A  . Years of Education: N/A   Occupational History  . Not on file.   Social History Main Topics  . Smoking status: Never Smoker   . Smokeless tobacco: Not on file  . Alcohol Use: No  . Drug Use: No  . Sexual Activity: No   Other Topics Concern  . Not on file   Social History Narrative  . No narrative on file     Objective: BP 215/99  Pulse 88  Wt 103 lb (46.72 kg)  General: Alert and Oriented, No Acute Distress HEENT: Pupils equal,  round, reactive to light. Conjunctivae clear.  moist mucous membranes pharynx unremarkable. Pain is reproduced with palpation of trapezius insertion on the base of the occiput, there is no overlying swelling redness or skin changes. No midline spinous process tenderness in the cervical spine. Full-strength and range of motion of the C-spine  Lungs: Clear to auscultation bilaterally, no  rhonchi nor rales.  she does have trace diffuse end expiratory wheezing cleared by coughing.  Comfortable work of breathing. Good air movement. Cardiac: Regular rate and rhythm. Normal S1/S2.  No murmurs, rubs, nor gallops.   Extremities: No peripheral edema.  Strong peripheral pulses.  full range of motion strength in both upper extremities  Mental Status: No depression, anxiety, nor agitation. Skin: Warm and dry.  Assessment & Plan: Angela Sloan was seen today for 1 month f/u anxiety.  Diagnoses and associated orders for this visit:  Lung nodule  Generalized anxiety disorder  Insomnia  Essential hypertension, benign - amLODipine (NORVASC) 10 MG tablet; Take 1 tablet (10 mg total) by mouth daily.  Neck pain - DG Cervical Spine Complete; Future    Lung nodule: I directed patient and her nephew downstairs to CT suite to schedule CT scan of the chest  Neck Pain: Obtaining plain films of the C-spine given chronicity of pain and lack of improvement with nonsteroidal anti-inflammatories or muscle relaxants Essential hypertension: Uncontrolled chronic condition increase amlodipine Anxiety with insomnia: Controlled continue Zoloft and  nighttime Ativan  Followup within the next 4 weeks   Return in about 4 weeks (around 12/17/2013).

## 2013-11-20 ENCOUNTER — Telehealth: Payer: Self-pay | Admitting: Family Medicine

## 2013-11-20 DIAGNOSIS — M47812 Spondylosis without myelopathy or radiculopathy, cervical region: Secondary | ICD-10-CM | POA: Insufficient documentation

## 2013-11-20 MED ORDER — CELECOXIB 100 MG PO CAPS: 100.0000 mg | ORAL_CAPSULE | Freq: Two times a day (BID) | ORAL | Status: DC

## 2013-11-20 NOTE — Telephone Encounter (Signed)
Sue Lushndrea, Will you please let patient or her nephew know that her xray revealed degenerative also known as aging changes in her spine but no other suprises.  I'd encourage her to try celecoxib instead of meloxicam as an anti-inflammatory.  We'll try different anti-inflammatories to see which one works the best without compromising blood pressure.

## 2013-11-20 NOTE — Telephone Encounter (Signed)
Left message on jerry's vm with results

## 2013-11-27 ENCOUNTER — Telehealth: Payer: Self-pay | Admitting: Family Medicine

## 2013-11-27 NOTE — Telephone Encounter (Signed)
Angela Sloan, Can you please contact [facility] and ask if they will cover an Rx for celebrex or generic 100mg  capsule taken twice a day.  [employer] states that they will not cover this medication since they are under the impression that Hospice will cover this medication.

## 2013-11-27 NOTE — Telephone Encounter (Signed)
Hospice does not cover this medication. Spoke with someone there and she says its non formulary. They do cover mobic, naporsyn,indomethacin

## 2013-11-28 NOTE — Telephone Encounter (Signed)
Misty,  Can you please relay this information to the patient's insurance company, I suspect this may trigger a PA therefore I'll put the PA form outside your office.

## 2013-11-28 NOTE — Telephone Encounter (Signed)
Insurance company states for pharmacy to run it through insurance only. Pharmacy states they will work on it because it keeps kicking back to Hospice, Pharmacy states they will call back with issues.

## 2013-12-20 ENCOUNTER — Other Ambulatory Visit: Payer: Self-pay | Admitting: Family Medicine

## 2013-12-20 MED ORDER — MELOXICAM 15 MG PO TABS: ORAL_TABLET | ORAL | Status: DC

## 2013-12-20 NOTE — Telephone Encounter (Signed)
Pt and son notified.

## 2013-12-20 NOTE — Telephone Encounter (Signed)
Sue Lush, Will you please let patient know that her insurance would not cover celebrex.  My recommendation would be to increase meloxicam to a total of 15mg  daily.  I've sent a new Rx of this to [facility].

## 2013-12-24 ENCOUNTER — Ambulatory Visit: Payer: Medicare Other | Admitting: Family Medicine

## 2014-01-01 ENCOUNTER — Telehealth: Payer: Self-pay | Admitting: *Deleted

## 2014-01-01 NOTE — Telephone Encounter (Signed)
Nancy from [facility] the [place] called requesting an order to continue hospice care for dx malnutrition.She needed a verbal order. Gave her the ok to continue hospice. [phone]

## 2014-01-15 ENCOUNTER — Telehealth: Payer: Self-pay | Admitting: *Deleted

## 2014-01-15 ENCOUNTER — Other Ambulatory Visit: Payer: Self-pay | Admitting: Family Medicine

## 2014-01-15 NOTE — Telephone Encounter (Signed)
Angela Sloan with [facility] called and states pt has been experiencing GERD sxs. Harriett SineNancy requests a rx for omeprazole

## 2014-01-16 MED ORDER — OMEPRAZOLE 40 MG PO CPDR: DELAYED_RELEASE_CAPSULE | ORAL | Status: DC

## 2014-01-16 NOTE — Telephone Encounter (Signed)
[facility].

## 2014-01-16 NOTE — Telephone Encounter (Signed)
Notified Harriett SineNancy at [facility]

## 2014-01-22 ENCOUNTER — Other Ambulatory Visit: Payer: Self-pay | Admitting: Family Medicine

## 2014-01-28 ENCOUNTER — Ambulatory Visit: Admitting: Family Medicine

## 2014-02-13 ENCOUNTER — Telehealth: Payer: Self-pay | Admitting: Family Medicine

## 2014-02-13 NOTE — Telephone Encounter (Signed)
Bryn GullingAndrea, I've placed a [employer] - Hospice form in you inbox.  Can you please fill out any empty fields that you know the answers to and fax it back to the requesting party?

## 2014-02-14 NOTE — Telephone Encounter (Signed)
Done and faxed

## 2014-02-17 ENCOUNTER — Telehealth: Payer: Self-pay | Admitting: *Deleted

## 2014-02-17 NOTE — Telephone Encounter (Signed)
[facility] and states they received the form that we faxed verifying that Tramadol was not part of Hospice dx. They have initiated an overide in order to see if her Medicare part D will pay for the medication

## 2014-02-19 ENCOUNTER — Other Ambulatory Visit: Payer: Self-pay | Admitting: Family Medicine

## 2014-04-01 ENCOUNTER — Telehealth: Payer: Self-pay | Admitting: *Deleted

## 2014-04-01 NOTE — Telephone Encounter (Signed)
Nancy from [facility] the [place] called and states they are d/c pt from their care because she not longer qualifies.FYI

## 2014-04-04 ENCOUNTER — Telehealth: Payer: Self-pay | Admitting: *Deleted

## 2014-04-04 NOTE — Telephone Encounter (Signed)
Nancy from [facility] the [place] called and states they want an order to resume care for her because she recently had a fall. Hospice would like to continue care. I called nancy at [phone] and gave her a verbal ok

## 2014-04-09 ENCOUNTER — Telehealth: Payer: Self-pay | Admitting: *Deleted

## 2014-04-09 ENCOUNTER — Other Ambulatory Visit: Payer: Self-pay | Admitting: Family Medicine

## 2014-04-09 DIAGNOSIS — G4700 Insomnia, unspecified: Secondary | ICD-10-CM

## 2014-04-09 MED ORDER — TRAZODONE HCL 50 MG PO TABS: ORAL_TABLET | ORAL | Status: DC

## 2014-04-09 NOTE — Telephone Encounter (Signed)
Angela Sloan for hospice called and state pt has not been able to sleep for the past several days. Pt is currently on Lorazepam 2 mg. The physician there at Hospice did not want to increase her lorazepam since she recently had a fall. Please advise

## 2014-04-09 NOTE — Telephone Encounter (Signed)
Prescription for trazodone now 100-150 mg as needed at bedtime for sleep has been sent to [facility] to address uncontrolled insomnia

## 2014-04-10 NOTE — Telephone Encounter (Signed)
Spoke with Angela Sloan at [facility] and pt is going to try  of Melatonin first

## 2014-05-29 ENCOUNTER — Ambulatory Visit (INDEPENDENT_AMBULATORY_CARE_PROVIDER_SITE_OTHER): Payer: Medicare Other | Admitting: Family Medicine

## 2014-05-29 ENCOUNTER — Encounter: Payer: Self-pay | Admitting: Family Medicine

## 2014-05-29 VITALS — BP 147/76 | HR 76 | Ht 60.0 in | Wt 107.0 lb

## 2014-05-29 DIAGNOSIS — M47812 Spondylosis without myelopathy or radiculopathy, cervical region: Secondary | ICD-10-CM

## 2014-05-29 DIAGNOSIS — E785 Hyperlipidemia, unspecified: Secondary | ICD-10-CM

## 2014-05-29 DIAGNOSIS — G4700 Insomnia, unspecified: Secondary | ICD-10-CM

## 2014-05-29 DIAGNOSIS — N289 Disorder of kidney and ureter, unspecified: Secondary | ICD-10-CM

## 2014-05-29 DIAGNOSIS — F411 Generalized anxiety disorder: Secondary | ICD-10-CM

## 2014-05-29 MED ORDER — LORAZEPAM 1 MG PO TABS: ORAL_TABLET | ORAL | Status: DC

## 2014-05-29 MED ORDER — SERTRALINE HCL 100 MG PO TABS: ORAL_TABLET | ORAL | Status: DC

## 2014-05-29 MED ORDER — CYCLOBENZAPRINE HCL 5 MG PO TABS: ORAL_TABLET | ORAL | Status: DC

## 2014-05-29 MED ORDER — AMLODIPINE BESYLATE 10 MG PO TABS: ORAL_TABLET | ORAL | Status: DC

## 2014-05-29 MED ORDER — DONEPEZIL HCL 5 MG PO TABS: ORAL_TABLET | ORAL | Status: DC

## 2014-05-29 MED ORDER — TRAMADOL HCL 50 MG PO TABS: ORAL_TABLET | ORAL | Status: DC

## 2014-05-29 MED ORDER — MELOXICAM 15 MG PO TABS: 15.0000 mg | ORAL_TABLET | Freq: Every day | ORAL | Status: DC

## 2014-05-29 MED ORDER — LISINOPRIL 20 MG PO TABS: ORAL_TABLET | ORAL | Status: DC

## 2014-05-29 NOTE — Progress Notes (Signed)
CC: Angela Sloan is a 80 y.o. female is here for f/u from Hospice   Subjective: HPI:  Accompanied by POA "Angela Sloan" - Niece  Discharge last week from hospice given favorable prognosis  Follow-up anxiety: Continues to take lorazepam 1-2 mg every evening to help with sleeping. Denies any anxiety complaints.  Follow-up hyperlipidemia: Continues on atorvastatin 20 mg daily. No report of myalgias or right upper quadrant pain.  Follow-up insomnia: Using lorazepam to help with sleep. She's no longer napping during the daytime. She is also getting better sleep ever since starting melatonin when under hospice care.  Follow-up renal insufficiency: Continues to to use meloxicam as a nonsteroidal anti-inflammatory. No other renal medications.  Follow-up cervical arthritis: Continues to take meloxicam once a day and Ultram twice a day to help with pain. She states that pain is not interfere with quality of life provided she takes tramadol twice a day within 12 hours frequency. No upper extremity motor or sensory disturbances     Review Of Systems Outlined In HPI  Past Medical History  Diagnosis Date  . Hypertension   . Hyperlipidemia     No past surgical history on file. Family History  Problem Relation Age of Onset  . Hypertension Brother     History   Social History  . Marital Status: Widowed    Spouse Name: N/A    Number of Children: N/A  . Years of Education: N/A   Occupational History  . Not on file.   Social History Main Topics  . Smoking status: Never Smoker   . Smokeless tobacco: Not on file  . Alcohol Use: No  . Drug Use: No  . Sexual Activity: No   Other Topics Concern  . Not on file   Social History Narrative     Objective: BP 147/76 mmHg  Pulse 76  Ht 5' (1.524 m)  Wt 107 lb (48.535 kg)  BMI 20.90 kg/m2  General: Alert and Oriented, No Acute Distress HEENT: Pupils equal, round, reactive to light. Conjunctivae clear.  Moist mucous membranes Lungs:  Clear to auscultation bilaterally, no wheezing/ronchi/rales.  Comfortable work of breathing. Good air movement. Cardiac: Regular rate and rhythm. Normal S1/S2.  No murmurs, rubs, nor gallops.   Extremities: No peripheral edema.  Strong peripheral pulses.  Mental Status: No depression, anxiety, nor agitation. Skin: Warm and dry.  Assessment & Plan: Angela Sloan was seen today for f/u from hospice.  Diagnoses and associated orders for this visit:  Generalized anxiety disorder  Hyperlipidemia  Insomnia  Renal insufficiency - BASIC METABOLIC PANEL WITH GFR  Cervical spine degeneration  Other Orders - cyclobenzaprine (FLEXERIL) 5 MG tablet; TAKE ONE TABLET AT BEDTIME AS NEEDED FOR NECK PAIN - sertraline (ZOLOFT) 100 MG tablet; TAKE ONE TABLET DAILY TO help prevent anxiety - meloxicam (MOBIC) 15 MG tablet; Take 1 tablet (15 mg total) by mouth daily. - LORazepam (ATIVAN) 1 MG tablet; ONE TO TWO TABS BY MOUTH AS NEEDED FOR SLEEP. - amLODipine (NORVASC) 10 MG tablet; Take 1 tablet (10 mg total) by mouth daily. - lisinopril (PRINIVIL,ZESTRIL) 20 MG tablet; TAKE ONE TABLET IN THE MORNING AND IN THE EVENING FOR BLOOD PRESSURE - donepezil (ARICEPT) 5 MG tablet; TAKE ONE TABLET AT BEDTIME TO PREVENT MEMORY LOSS - traMADol (ULTRAM) 50 MG tablet; TAKE 1-2 TABLETS EVERY 8 HOURS AS NEEDED FOR PAIN    Anxiety: Control continue Zoloft and as needed lorazepam Hyperlipidemia: clinically controlled continue atorvastatin, lipid panel in the future when fasting Insomnia: Controlled continue melatonin  and lorazepam Renal insufficiency: Clinically controlled due for renal function Cervical spine arthritis: Controlled continue meloxicam pending renal function, continue tramadol    Return in about 3 months (around 08/29/2014) for BP and Pain follow up.

## 2014-05-30 LAB — BASIC METABOLIC PANEL WITH GFR
BUN: 22 mg/dL (ref 6–23)
CO2: 25 mEq/L (ref 19–32)
Calcium: 8.8 mg/dL (ref 8.4–10.5)
Chloride: 105 mEq/L (ref 96–112)
Creat: 0.82 mg/dL (ref 0.50–1.10)
GFR, EST NON AFRICAN AMERICAN: 60 mL/min
GFR, Est African American: 69 mL/min
Glucose, Bld: 87 mg/dL (ref 70–99)
POTASSIUM: 4.2 meq/L (ref 3.5–5.3)
Sodium: 141 mEq/L (ref 135–145)

## 2014-06-16 ENCOUNTER — Telehealth: Payer: Self-pay | Admitting: *Deleted

## 2014-06-16 NOTE — Telephone Encounter (Signed)
I'd recommend an appointment to address this.

## 2014-06-16 NOTE — Telephone Encounter (Signed)
Left voicemail to call and schedule appointment to be seen.Angela Sloan. Onica Davidovich, CMA

## 2014-06-16 NOTE — Telephone Encounter (Signed)
Angela Sloan calls that she is having bilateral ankle swelling. She denies SOB or any other symptoms just swelling. She said that she has been setting with her feet elevated and has been watching salt intake but swelling is not going down. Please advise. Corliss SkainsJamie Pinkie Manger, CMA

## 2014-06-18 NOTE — Telephone Encounter (Signed)
2nd attempt calling patient and another voicemail was left. Corliss SkainsJamie Tavarius Grewe, CMA

## 2014-06-24 ENCOUNTER — Other Ambulatory Visit: Payer: Self-pay | Admitting: Family Medicine

## 2014-07-02 ENCOUNTER — Telehealth: Payer: Self-pay | Admitting: *Deleted

## 2014-07-02 NOTE — Telephone Encounter (Signed)
Salomon FickLinda Mason called re this pt and states she called on Monday and about pt having swelling in the leg and sob and no one returned her call. I called [name] back and let her know that on the 23 of Nov we did receive her call and attempted to call her several times. Annabell Sabaldvised Linda that pt needs appt and she was transferred up front to be scheduled

## 2014-07-04 ENCOUNTER — Ambulatory Visit (INDEPENDENT_AMBULATORY_CARE_PROVIDER_SITE_OTHER): Payer: Medicare Other

## 2014-07-04 ENCOUNTER — Ambulatory Visit (INDEPENDENT_AMBULATORY_CARE_PROVIDER_SITE_OTHER): Payer: Medicare Other | Admitting: Family Medicine

## 2014-07-04 ENCOUNTER — Encounter: Payer: Self-pay | Admitting: Family Medicine

## 2014-07-04 VITALS — BP 177/91 | HR 92 | Wt 107.0 lb

## 2014-07-04 DIAGNOSIS — M25661 Stiffness of right knee, not elsewhere classified: Secondary | ICD-10-CM

## 2014-07-04 DIAGNOSIS — R062 Wheezing: Secondary | ICD-10-CM

## 2014-07-04 DIAGNOSIS — M25561 Pain in right knee: Secondary | ICD-10-CM

## 2014-07-04 DIAGNOSIS — M25473 Effusion, unspecified ankle: Secondary | ICD-10-CM

## 2014-07-04 DIAGNOSIS — M25551 Pain in right hip: Secondary | ICD-10-CM

## 2014-07-04 MED ORDER — ALBUTEROL SULFATE 108 (90 BASE) MCG/ACT IN AEPB: 2.0000 | INHALATION_SPRAY | RESPIRATORY_TRACT | Status: DC | PRN

## 2014-07-04 NOTE — Progress Notes (Signed)
CC: Angela Sloan is a 80 y.o. female is here for Leg Swelling   Subjective: HPI:   accompanied by Angela Sloan   patient complains of bilateral ankle swelling. She states it does not bother her psychologically or physically but other people have been mentioning it to her. It is present all hours of the day worse if she is on her feet. It is improved with elevating her feet for at least an hour. It does not seem to be getting better or worse. She noticed it a few weeks ago. She denies any orthopnea or peripheral edema elsewhere or PND. She denies any exertional chest pain or chest pain at rest. She denies any irregular heartbeat or rapid heartbeat.  On review of systems she reports that she does get short of breath when talking fast but not with exerting herself. She denies cough, wheezing, fevers or chills.  She wants to know if I can refer her to a bone specialist for her chronic hip and knee pain. She localizes the pain in the groin of the right hip nothing particularly makes it better or worse. Her right knee pain is localized"deep"in the joint and nonradiating. She is uncertain what makes it better or worse however she thinks she may have had some benefit from meloxicam. She specifically wants to have a specialist manage this   Review Of Systems Outlined In HPI  Past Medical History  Diagnosis Date  . Hypertension   . Hyperlipidemia     No past surgical history on file. Family History  Problem Relation Age of Onset  . Hypertension Brother     History   Social History  . Marital Status: Widowed    Spouse Name: N/A    Number of Children: N/A  . Years of Education: N/A   Occupational History  . Not on file.   Social History Main Topics  . Smoking status: Never Smoker   . Smokeless tobacco: Not on file  . Alcohol Use: No  . Drug Use: No  . Sexual Activity: No   Other Topics Concern  . Not on file   Social History Narrative     Objective: BP 177/91 mmHg  Pulse 92  Wt  107 lb (48.535 kg)  SpO2 94%  General: Alert and Oriented, No Acute Distress HEENT: Pupils equal, round, reactive to light. Conjunctivae clear.  Moist mucous membranes times unremarkable Lungs: comfortable work of breathing with trace end expiratory wheezing in all lung fields without rhonchi nor rales. No signs of consolidation Cardiac: Regular rate and rhythm. Normal S1/S2.  No murmurs, rubs, nor gallops.   Extremities: trace nonpitting edema of the ankles bilaterally, no edema elsewhere.  Strong peripheral pulses.  Mental Status: No depression, anxiety, nor agitation. Skin: Warm and dry.  Assessment & Plan: Angela Sloan was seen today for leg swelling.  Diagnoses and associated orders for this visit:  Right hip pain - DG Hip Complete Right; Future  Right knee pain - DG Knee 1-2 Views Right; Future  Wheezing  Ankle swelling, unspecified laterality  Other Orders - Albuterol Sulfate (PROAIR RESPICLICK) 108 (90 BASE) MCG/ACT AEPB; Inhale 2 Inhalers into the lungs every 4 (four) hours as needed (shortness of breath).    Wheezing: CT scan from earlier this year reviewed showing no chronic lung disease. Suspect she is having some reactive airway disease to some environmental irritant therefore provided with albuterol powder inhaler today. Time was taken to demonstrate how to use this and she demonstrated back to me that she  was using it correctly. Ankle swelling: Suspect venous insufficiency, discussed that this is benign if not causing pain or causing skin changes. Low suspicion of congestive heart failure. We will follow this clinically to see if we need to order an echocardiogram, discussed no need for diuretics given lack of pain and mild degree of swelling Right hip pain and right knee pain, she understandably does not ever want to pursue surgical interventions, I'm going to refer her to Dr. Karie Sloan and sports medicine for nonsurgical options since she specifically wants a specialist to manage  this. Obtaining x-rays today to make their first visit more productive.  Return for 1-2 weeks for Dr. Karie Sloan sports medicine consult.

## 2014-07-07 ENCOUNTER — Telehealth: Payer: Self-pay | Admitting: Family Medicine

## 2014-07-07 NOTE — Telephone Encounter (Signed)
Pt notified; she would like to wait until after xmas to schedule appt

## 2014-07-07 NOTE — Telephone Encounter (Signed)
Angela Sloan, Will you please let patient or her family know that her hip and knee xrays showed degenerative changes as was expected based on her pain.  I would still recommend that she visit with Dr. Karie Schwalbe for a sports medicine consult to discuss non-operative management options.

## 2014-07-09 ENCOUNTER — Other Ambulatory Visit: Payer: Self-pay | Admitting: Family Medicine

## 2014-07-11 ENCOUNTER — Encounter: Payer: Self-pay | Admitting: Sports Medicine

## 2014-07-11 ENCOUNTER — Ambulatory Visit (INDEPENDENT_AMBULATORY_CARE_PROVIDER_SITE_OTHER): Payer: Medicare Other | Admitting: Sports Medicine

## 2014-07-11 VITALS — BP 141/65 | HR 59 | Wt 109.0 lb

## 2014-07-11 DIAGNOSIS — M47812 Spondylosis without myelopathy or radiculopathy, cervical region: Secondary | ICD-10-CM

## 2014-07-11 DIAGNOSIS — M1611 Unilateral primary osteoarthritis, right hip: Secondary | ICD-10-CM

## 2014-07-11 NOTE — Assessment & Plan Note (Signed)
X-rays do show some C5 on C6 spondylolisthesis. Formal physical therapy. If no better at the return visit we will do an MRI for interventional planning.

## 2014-07-11 NOTE — Assessment & Plan Note (Signed)
X-rays confirm severe right hip osteoarthritis with pain referable to the groin. Right-sided femoral acetabular joint injection under ultrasound guidance. Physical therapy.  Return to see me in one month.

## 2014-07-11 NOTE — Progress Notes (Signed)
   Subjective:    I'm seeing this patient as a consultation for:  Dr. Ivan AnchorsHommel  CC: Right hip pain  HPI: This is a very pleasant 80 -year-old female, she comes in with a one year history of pain that she localizes in the right groin, moderate, persistent without radiation, worse with weightbearing.  She has not responded to oral NSAIDs.  Neck pain: Amenable to discussed this at a future visit however pain is worse in the posterior aspect of the neck and worse with extension.  Past medical history, Surgical history, Family history not pertinant except as noted below, Social history, Allergies, and medications have been entered into the medical record, reviewed, and no changes needed.   Review of Systems: No headache, visual changes, nausea, vomiting, diarrhea, constipation, dizziness, abdominal pain, skin rash, fevers, chills, night sweats, weight loss, swollen lymph nodes, body aches, joint swelling, muscle aches, chest pain, shortness of breath, mood changes, visual or auditory hallucinations.   Objective:   General: Well Developed, well nourished, and in no acute distress.  Neuro/Psych: Alert and oriented x3, extra-ocular muscles intact, able to move all 4 extremities, sensation grossly intact. Skin: Warm and dry, no rashes noted.  Respiratory: Not using accessory muscles, speaking in full sentences, trachea midline.  Cardiovascular: Pulses palpable, no extremity edema. Abdomen: Does not appear distended. Right Hip: ROM IR: 60 Deg, ER: 60 Deg, Flexion: 120 Deg, Extension: 100 Deg, Abduction: 45 Deg, Adduction: 45 Deg there is a reproduction of groin pain with internal rotation. Strength IR: 5/5, ER: 5/5, Flexion: 5/5, Extension: 5/5, Abduction: 5/5, Adduction: 5/5 Pelvic alignment unremarkable to inspection and palpation. Standing hip rotation and gait without trendelenburg / unsteadiness. Greater trochanter without tenderness to palpation. No tenderness over piriformis. No SI joint  tenderness and normal minimal SI movement.  Procedure: Real-time Ultrasound Guided Injection of right femoral acetabular joint Device: GE Logiq E  Verbal informed consent obtained.  Time-out conducted.  Noted no overlying erythema, induration, or other signs of local infection.  Skin prepped in a sterile fashion.  Local anesthesia: Topical Ethyl chloride.  With sterile technique and under real time ultrasound guidance:  Noted mild to moderate joint effusion, spinal needle advanced into the femoral head/neck junction, contacted bone and withdrawn slightly, 2 mL kenalog 40, 4 mL lidocaine then injected easily. Completed without difficulty  Pain immediately resolved suggesting accurate placement of the medication.  Advised to call if fevers/chills, erythema, induration, drainage, or persistent bleeding.  Images permanently stored and available for review in the ultrasound unit.  Impression: Technically successful ultrasound guided injection.  Impression and Recommendations:   This case required medical decision making of moderate complexity.

## 2014-07-22 ENCOUNTER — Ambulatory Visit (INDEPENDENT_AMBULATORY_CARE_PROVIDER_SITE_OTHER): Payer: Medicare Other | Admitting: Physical Therapy

## 2014-07-22 DIAGNOSIS — M6281 Muscle weakness (generalized): Secondary | ICD-10-CM

## 2014-07-22 DIAGNOSIS — R293 Abnormal posture: Secondary | ICD-10-CM

## 2014-07-22 DIAGNOSIS — M2550 Pain in unspecified joint: Secondary | ICD-10-CM

## 2014-07-22 DIAGNOSIS — M47812 Spondylosis without myelopathy or radiculopathy, cervical region: Secondary | ICD-10-CM

## 2014-07-22 DIAGNOSIS — M1611 Unilateral primary osteoarthritis, right hip: Secondary | ICD-10-CM

## 2014-07-30 ENCOUNTER — Encounter: Payer: Medicare Other | Admitting: Physical Therapy

## 2014-07-30 ENCOUNTER — Other Ambulatory Visit: Payer: Self-pay | Admitting: Family Medicine

## 2014-08-11 ENCOUNTER — Ambulatory Visit (INDEPENDENT_AMBULATORY_CARE_PROVIDER_SITE_OTHER): Payer: Medicare Other | Admitting: Family Medicine

## 2014-08-11 ENCOUNTER — Encounter: Payer: Self-pay | Admitting: Family Medicine

## 2014-08-11 ENCOUNTER — Telehealth: Payer: Self-pay | Admitting: Family Medicine

## 2014-08-11 VITALS — BP 148/77 | HR 84 | Wt 107.0 lb

## 2014-08-11 DIAGNOSIS — M47812 Spondylosis without myelopathy or radiculopathy, cervical region: Secondary | ICD-10-CM

## 2014-08-11 MED ORDER — PREDNISONE 20 MG PO TABS: ORAL_TABLET | ORAL | Status: AC

## 2014-08-11 NOTE — Telephone Encounter (Signed)
Sue Lushndrea, Can you see if [facility] has any imaging results for this patient over the past ten years?  One of her family members was told that in 2010 she had imaging of her head or neck that showed an aneurysm and I'm trying to clarify if this is true.  She may have been seeing this clinic [facility]"

## 2014-08-11 NOTE — Progress Notes (Signed)
CC: Angela Sloan is a 81 y.o. female is here for Neck Pain   Subjective: HPI:  Complains of worsening neck pain described as a constant ache localized in the posterior neck in the midline that radiates up into the back of the head. Slightly improved with tramadol or Goody powder, nothing particularly makes it worse. Is accompanied by nausea, the worse her pain is the more nauseous she feels. She denies any recent fall or injury since I saw her last. Denies any accompanying motor or sensory disturbances with the above chief complaint. She was referred for physical therapy however felt that the therapy was to physically taxing and decided not to follow-up with future appointments. Denies throat pain, dysphagia, abdominal pain, confusion, vision changes, nor radiation of pain into her upper extremities.  Her family member, Angela Sloan, tells me that she was recently informed that in 2010 the patient had an MRI of her neck that showed an aneurysm which was close to be followed up on however nobody has been addressing this. Patient believes that this is true but is unable to recall any more information. She is confident that her headache or neck pain does not have any pulsatile component to the pain.   Review Of Systems Outlined In HPI  Past Medical History  Diagnosis Date  . Hypertension   . Hyperlipidemia     No past surgical history on file. Family History  Problem Relation Age of Onset  . Hypertension Brother     History   Social History  . Marital Status: Widowed    Spouse Name: N/A    Number of Children: N/A  . Years of Education: N/A   Occupational History  . Not on file.   Social History Main Topics  . Smoking status: Never Smoker   . Smokeless tobacco: Not on file  . Alcohol Use: No  . Drug Use: No  . Sexual Activity: No   Other Topics Concern  . Not on file   Social History Narrative     Objective: BP 148/77 mmHg  Pulse 84  Wt 107 lb (48.535 kg)  General: Alert  and Oriented, No Acute Distress HEENT: Pupils equal, round, reactive to light. Conjunctivae clear.  External ears unremarkable, canals clear with intact TMs with appropriate landmarks.  Middle ear appears open without effusion. Pink inferior turbinates.  Moist mucous membranes, pharynx without inflammation nor lesions.  Neck supple without palpable lymphadenopathy nor abnormal masses. Lungs: Clear to auscultation bilaterally, no wheezing/ronchi/rales.  Comfortable work of breathing. Good air movement. Cardiac: Regular rate and rhythm. Normal S1/S2.  No murmurs, rubs, nor gallops.   Neck: No midline spinous process tenderness in the cervical spine, full range of motion and strength in all 3 planes of the cervical spine Extremities: No peripheral edema.  Strong peripheral pulses.  Mental Status: No depression, anxiety, nor agitation. Skin: Warm and dry.  Assessment & Plan: Angela Sloan was seen today for neck pain.  Diagnoses and associated orders for this visit:  Cervical spondylosis without myelopathy - predniSONE (DELTASONE) 20 MG tablet; Three tabs daily days 1-3, two tabs daily days 4-6, one tab daily days 7-9, half tab daily days 10-13.    Cervical spondylosis: Worsening chronic condition, continue as needed tramadol begin prednisone taper. If no benefit by the end of the week will obtain MRI for Angela Sloan's recommendations.  [facility] known [facility] to look into the possibility of her actually having an aneurysm  in the brain or cervical region.   Return if symptoms worsen or fail to improve.

## 2014-08-13 NOTE — Telephone Encounter (Signed)
Called [facility] [phone] and [phone]. They have no record of patient being seen there

## 2014-08-20 ENCOUNTER — Other Ambulatory Visit: Payer: Self-pay | Admitting: Family Medicine

## 2014-08-25 ENCOUNTER — Other Ambulatory Visit: Payer: Self-pay | Admitting: Family Medicine

## 2014-08-25 NOTE — Telephone Encounter (Signed)
Pt is due for a f/u appt.

## 2014-08-28 ENCOUNTER — Telehealth: Payer: Self-pay

## 2014-08-28 NOTE — Telephone Encounter (Signed)
Patient seen in office

## 2014-08-28 NOTE — Telephone Encounter (Signed)
Waiting on Pa from cover my meds - CF

## 2014-08-29 ENCOUNTER — Telehealth: Payer: Self-pay

## 2014-08-29 ENCOUNTER — Ambulatory Visit (INDEPENDENT_AMBULATORY_CARE_PROVIDER_SITE_OTHER): Payer: Medicare Other | Admitting: Family Medicine

## 2014-08-29 ENCOUNTER — Encounter: Payer: Self-pay | Admitting: Family Medicine

## 2014-08-29 DIAGNOSIS — I10 Essential (primary) hypertension: Secondary | ICD-10-CM

## 2014-08-29 DIAGNOSIS — M47812 Spondylosis without myelopathy or radiculopathy, cervical region: Secondary | ICD-10-CM

## 2014-08-29 DIAGNOSIS — R0602 Shortness of breath: Secondary | ICD-10-CM

## 2014-08-29 DIAGNOSIS — J449 Chronic obstructive pulmonary disease, unspecified: Secondary | ICD-10-CM

## 2014-08-29 DIAGNOSIS — IMO0001 Reserved for inherently not codable concepts without codable children: Secondary | ICD-10-CM

## 2014-08-29 MED ORDER — LISINOPRIL-HYDROCHLOROTHIAZIDE 20-25 MG PO TABS: 1.0000 | ORAL_TABLET | Freq: Every day | ORAL | Status: DC

## 2014-08-29 MED ORDER — TIOTROPIUM BROMIDE-OLODATEROL 2.5-2.5 MCG/ACT IN AERS: 1.0000 | INHALATION_SPRAY | Freq: Every day | RESPIRATORY_TRACT | Status: DC

## 2014-08-29 MED ORDER — TRAMADOL HCL 50 MG PO TABS: ORAL_TABLET | ORAL | Status: DC

## 2014-08-29 NOTE — Progress Notes (Signed)
CC: Angela Sloan is a 81 y.o. female is here for Hypertension   Subjective: HPI:  Follow-up essential hypertension: Continues to take lisinopril twice a day. No outside blood pressures to report. There has been no chest pain nor motor or sensory disturbances. Denies any known side effects.  Follow-up cough: She continues to have shortness of breath and cough since I saw her last. It was only mild in severity and not really bothering her at her last visit however today seems to be moderate in severity. Shortest of breath is only present with activity. She denies wheezing. She's been using albuterol but does not feel like it's really helping much. She is uncertain how often she's been using the albuterol. Cough is nonproductive. She denies fevers, chills, new back pain.  Follow-up neck pain: She's been taking half of the tramadol twice a day. At this dose she still requires goody powder frequent throughout the day. I don't think she is really taking Celebrex when I directly questioned her about this. She's tried physical therapy for this pain but was unable to follow through with it. Pain is still in the back of the neck that radiates up the back of the head into the top of the skull. She denies any pain radiation into either arm. Pain is worse with extension of the neck. The pain is so bad that she often spends the majority of the day in bed because that's where she has the most minimal amount of pain. She denies any recent falls or close calls.denies dysphagia or weakness in the upper extremities   Review Of Systems Outlined In HPI  Past Medical History  Diagnosis Date  . Hypertension   . Hyperlipidemia     No past surgical history on file. Family History  Problem Relation Age of Onset  . Hypertension Brother     History   Social History  . Marital Status: Widowed    Spouse Name: N/A    Number of Children: N/A  . Years of Education: N/A   Occupational History  . Not on file.    Social History Main Topics  . Smoking status: Never Smoker   . Smokeless tobacco: Not on file  . Alcohol Use: No  . Drug Use: No  . Sexual Activity: No   Other Topics Concern  . Not on file   Social History Narrative     Objective: BP 160/77 mmHg  Pulse 109  Wt 104 lb (47.174 kg)  General: Alert and Oriented, No Acute Distress HEENT: Pupils equal, round, reactive to light. Conjunctivae clear.  Moist mucous membranes pharynx unremarkable Lungs: comfortable work of breathing with inspiratory and expiratory wheezing no rhonchi or rales. Cardiac: Regular rate and rhythm. Normal S1/S2.  No murmurs, rubs, nor gallops.   Neck: Full range of motion and strength in all 3 planes of the cervical spine. No midline spinous process tenderness. No paraspinal musculature tenderness on the left of the right with palpation. No palpable abnormality Extremities: No peripheral edema.  Strong peripheral pulses.  Mental Status: No depression, anxiety, nor agitation. Skin: Warm and dry.  Assessment & Plan: [name] was seen today for hypertension.  Diagnoses and associated orders for this visit:  Essential hypertension, benign  COPD bronchitis  SOB (shortness of breath) - CBC  Cervical spondylosis without myelopathy - MR Cervical Spine Wo Contrast; Future  Other Orders - Tiotropium Bromide-Olodaterol (STIOLTO RESPIMAT) 2.5-2.5 MCG/ACT AERS; Inhale 1 Inhaler into the lungs daily. - lisinopril-hydrochlorothiazide (PRINZIDE,ZESTORETIC) 20-25 MG per tablet;  Take 1 tablet by mouth daily. - traMADol (ULTRAM) 50 MG tablet; TAKE 1-2 TABLETS EVERY 8 HOURS AS NEEDED FOR PAIN    Essential hypertension: Uncontrolled chronic condition adding hydrochlorothiazide to lisinopril COPD: advised to stop albuterol and switch to Stiolto. Time was taken to demonstrate how to use this product, both her niece and herself were able to teach back often to use this device and how to properly use it. She took a  single dose here in the clinic. Cervical spondylosis: encourage her to increase her tramadol to up to 100 mg 3 times a day. Given persistent pain we'll obtain MRI in to see if she is a candidate for steroid injections. For her shortness of breath and still like to get her hemoglobin checked today to make sure anemia is not contributing to her shortness of breath along with COPD.  40 minutes spent face-to-face during visit today of which at least 50% was counseling or coordinating care regarding: 1. Essential hypertension, benign   2. COPD bronchitis   3. SOB (shortness of breath)   4. Cervical spondylosis without myelopathy      Return in about 4 weeks (around 09/26/2014) for Breathing and Neck.

## 2014-08-29 NOTE — Telephone Encounter (Signed)
PA required for MRI cervical spinn without contrast - Approved [id number] good until 09/27/2014

## 2014-08-30 LAB — CBC
HEMATOCRIT: 46.1 % — AB (ref 36.0–46.0)
HEMOGLOBIN: 15.4 g/dL — AB (ref 12.0–15.0)
MCH: 30.3 pg (ref 26.0–34.0)
MCHC: 33.4 g/dL (ref 30.0–36.0)
MCV: 90.6 fL (ref 78.0–100.0)
MPV: 10.5 fL (ref 8.6–12.4)
Platelets: 207 10*3/uL (ref 150–400)
RBC: 5.09 MIL/uL (ref 3.87–5.11)
RDW: 14.7 % (ref 11.5–15.5)
WBC: 7.7 10*3/uL (ref 4.0–10.5)

## 2014-09-04 ENCOUNTER — Telehealth: Payer: Self-pay

## 2014-09-04 NOTE — Telephone Encounter (Signed)
Received auth for Lorazepam it expires on 08/29/2015 - CF

## 2014-09-08 ENCOUNTER — Other Ambulatory Visit: Payer: Medicare Other

## 2014-09-15 ENCOUNTER — Ambulatory Visit (INDEPENDENT_AMBULATORY_CARE_PROVIDER_SITE_OTHER): Payer: Medicare Other

## 2014-09-15 DIAGNOSIS — M2578 Osteophyte, vertebrae: Secondary | ICD-10-CM

## 2014-09-15 DIAGNOSIS — M4312 Spondylolisthesis, cervical region: Secondary | ICD-10-CM

## 2014-09-15 DIAGNOSIS — M47812 Spondylosis without myelopathy or radiculopathy, cervical region: Secondary | ICD-10-CM

## 2014-09-15 DIAGNOSIS — M4802 Spinal stenosis, cervical region: Secondary | ICD-10-CM

## 2014-09-19 ENCOUNTER — Encounter: Payer: Self-pay | Admitting: Sports Medicine

## 2014-09-19 ENCOUNTER — Ambulatory Visit (INDEPENDENT_AMBULATORY_CARE_PROVIDER_SITE_OTHER): Payer: Medicare Other | Admitting: Sports Medicine

## 2014-09-19 DIAGNOSIS — M47812 Spondylosis without myelopathy or radiculopathy, cervical region: Secondary | ICD-10-CM

## 2014-09-19 MED ORDER — KETOROLAC TROMETHAMINE 30 MG/ML IJ SOLN
30.0000 mg | Freq: Once | INTRAMUSCULAR | Status: AC
  Administered 2014-09-19: 30 mg via INTRAMUSCULAR

## 2014-09-19 MED ORDER — HYDROCODONE-ACETAMINOPHEN 5-325 MG PO TABS: 1.0000 | ORAL_TABLET | Freq: Three times a day (TID) | ORAL | Status: DC | PRN

## 2014-09-19 NOTE — Progress Notes (Signed)
   Subjective:    I'm seeing this patient as a consultation for:  Dr. Laren BoomSean Hommel  CC:  "neck pain"  HPI: Patient presents with complaint of 8 months of worsening neck pain. The pain is "100/10", constant, dull, begins at the sides of her neck (points to SCMs bilaterally) and radiates backward toward the center of her neck and to head and eyes. She has been taking tramadol which has offered only partial relief. The pain is not exacerbated by anything in particular. Her neck feels stiff and she has been unable to tolerate physical therapy. She denies any numbness or weakness in her upper extremities. She had an MRI 09/15/14 cervical spine which showed degenerative disease with foraminal narrowing with stenosis at multiple levels, anterolisthesis with osteophyte complexes at C5-6, C6-7.   Past medical history, Surgical history, Family history not pertinant except as noted below, Social history, Allergies, and medications have been entered into the medical record, reviewed, and no changes needed.   Review of Systems: No headache, visual changes, nausea, vomiting, diarrhea, constipation, dizziness, abdominal pain, skin rash, fevers, chills, night sweats, weight loss, swollen lymph nodes, body aches, joint swelling, muscle aches, chest pain, shortness of breath, mood changes, visual or auditory hallucinations.   Objective:   General: Well Developed, well nourished, and in no acute distress.  Neuro/Psych: Alert and oriented x3, extra-ocular muscles intact, able to move all 4 extremities, sensation grossly intact. Skin: Warm and dry, no rashes noted.  Respiratory: Not using accessory muscles, speaking in full sentences, trachea midline.  Cardiovascular: Pulses palpable, no extremity edema. Abdomen: Does not appear distended. MSK: No spinal tenderness to palpation, however palpation of cervical and shoulder musculature did reproduce pain.    Impression and Recommendations:   This case required  medical decision making of moderate complexity.  # Cervical Spondylosis without Myelopathy - Current symptoms do not localize to a specific vertebral level and are unlikely to represent a radiculopathy, but instead likely due to diffuse muscle spasm. - Toradol 30 mg IM given in the office now - Continue Tramadol 50 mg Q4H prn pain - Hydrocodone/APAP 5/325 Q8H prn pain - Patient referred for Home health PT for topical modalities  Follow up in 3 weeks or sooner if needed

## 2014-09-19 NOTE — Assessment & Plan Note (Signed)
Progressive neck pain, with multilevel cervical spondylosis and some spondylolisthesis, symptoms are predominantly myofascial. She has no radicular symptoms, and no signs of myelopathy. Toradol 30 mg intramuscular, home health physical therapy for predominantly topical modalities, and I am going to give her a bit of hydrocodone. Return to see me in 2-3 weeks to see how things are going.

## 2014-09-24 NOTE — Telephone Encounter (Signed)
Opened in error

## 2014-09-29 ENCOUNTER — Telehealth: Payer: Self-pay

## 2014-09-29 ENCOUNTER — Ambulatory Visit (INDEPENDENT_AMBULATORY_CARE_PROVIDER_SITE_OTHER): Payer: Medicare Other | Admitting: Family Medicine

## 2014-09-29 ENCOUNTER — Encounter: Payer: Self-pay | Admitting: Family Medicine

## 2014-09-29 VITALS — BP 164/82 | HR 99 | Wt 106.0 lb

## 2014-09-29 DIAGNOSIS — I10 Essential (primary) hypertension: Secondary | ICD-10-CM

## 2014-09-29 DIAGNOSIS — R443 Hallucinations, unspecified: Secondary | ICD-10-CM

## 2014-09-29 DIAGNOSIS — IMO0001 Reserved for inherently not codable concepts without codable children: Secondary | ICD-10-CM

## 2014-09-29 DIAGNOSIS — J449 Chronic obstructive pulmonary disease, unspecified: Secondary | ICD-10-CM

## 2014-09-29 DIAGNOSIS — M47812 Spondylosis without myelopathy or radiculopathy, cervical region: Secondary | ICD-10-CM

## 2014-09-29 MED ORDER — BUDESONIDE-FORMOTEROL FUMARATE 80-4.5 MCG/ACT IN AERO: 2.0000 | INHALATION_SPRAY | Freq: Two times a day (BID) | RESPIRATORY_TRACT | Status: DC

## 2014-09-29 MED ORDER — TIOTROPIUM BROMIDE-OLODATEROL 2.5-2.5 MCG/ACT IN AERS: 2.0000 | INHALATION_SPRAY | Freq: Every day | RESPIRATORY_TRACT | Status: DC

## 2014-09-29 MED ORDER — HYDROCHLOROTHIAZIDE 25 MG PO TABS: ORAL_TABLET | ORAL | Status: DC

## 2014-09-29 NOTE — Telephone Encounter (Signed)
Message left on vm 

## 2014-09-29 NOTE — Telephone Encounter (Signed)
Patients pharmacy called to notify us that the patients insurance will not cover Stiolto Respimat. They would like to know if there is an alternate medication she could try that may covered be by her insurance. Please advise. Minna AntisEbony Brigham, [place]

## 2014-09-29 NOTE — Telephone Encounter (Signed)
[facility]

## 2014-09-29 NOTE — Progress Notes (Signed)
CC: Angela Sloan is a 81 y.o. female is here for f/u breathing and f/u neck  pain   Subjective: HPI:  Follow-up COPD: Since starting stiolto she's noticed that her cough is significantly decreased. Family members have noticed that they don't hear her wheezing as much. She denies any shortness of breath but feels like the inhaler is helping. She is only taking this once a day and has had no known side effects since starting this medication. Denies chest discomfort.  Follow up neck pain: They were unable to get physical therapy due to some insurance issues with the original physical therapy office that was notified. Patient still desires home physical therapy for her neck pain. Pain is not changed since I saw her last with respect to the neck other than it is greatly improved for 6-8 hours if she takes 2 tramadol the same time. She started doing this 2 or 3 weeks ago and she's noticed that if she takes 2 tablets later in the day she will have hallucinations where she will wake up in the middle of the night and begin talking to someone in her apartment only to then realized that there is nobody there. She's also had recurrent episodes or soon after she wakes up she sees flowers all over the wall or the floor.  These hallucinations are never frightening but she's never had them before.  Follow-up hypertension: Since starting lisinopril-hydrochlorothiazide she's had no outside blood pressures to report. No chest pain shortness of breath orthopnea peripheral edema nor motor or sensory disturbances other than that described above   Review Of Systems Outlined In HPI  Past Medical History  Diagnosis Date  . Hypertension   . Hyperlipidemia     No past surgical history on file. Family History  Problem Relation Age of Onset  . Hypertension Brother     History   Social History  . Marital Status: Widowed    Spouse Name: N/A  . Number of Children: N/A  . Years of Education: N/A   Occupational  History  . Not on file.   Social History Main Topics  . Smoking status: Never Smoker   . Smokeless tobacco: Not on file  . Alcohol Use: No  . Drug Use: No  . Sexual Activity: No   Other Topics Concern  . Not on file   Social History Narrative     Objective: BP 164/82 mmHg  Pulse 99  Wt 106 lb (48.081 kg)  SpO2 93%  General: Alert and Oriented, No Acute Distress HEENT: Pupils equal, round, reactive to light. Conjunctivae clear.  Moist mucous membranes pharynx unremarkable Lungs: Comfortable work of breathing with no rhonchi or rales. Trace end expiratory wheezing in the lower lung fields Cardiac: Regular rate and rhythm. Normal S1/S2.  No murmurs, rubs, nor gallops.   Extremities: No peripheral edema.  Strong peripheral pulses.  Mental Status: No depression, anxiety, nor agitation. Skin: Warm and dry.  Assessment & Plan: Angela Sloan was seen today for f/u breathing and f/u neck  pain.  Diagnoses and all orders for this visit:  COPD bronchitis Orders: -     Tiotropium Bromide-Olodaterol (STIOLTO RESPIMAT) 2.5-2.5 MCG/ACT AERS; Inhale 2 puffs into the lungs daily.  Cervical spondylosis without myelopathy  Essential hypertension Orders: -     hydrochlorothiazide (HYDRODIURIL) 25 MG tablet; One tablet by mouth every morning for blood pressure control. Take in addition to Lisinopril-HCTZ  Hallucination Orders: -     Urinalysis, Routine w reflex microscopic -  COMPLETE METABOLIC PANEL WITH GFR   COPD: Improved on stiolto, increase to 2 inhalations daily given persistent wheezing on exam today Cervical spondylosis: Suspected tramadol is causing some of her hallucinations, will rule out more serious pathology such as electrolyte abnormality, hypoglycemia or UTI. Discussed balancing the risks of her hallucinations with the benefit of pain relief while using tramadol, Fortune expect that her need for tramadol will decrease once physical therapy is arranged. I've asked my front  staff to help out coordinating this. Essential hypertension: Uncontrolled chronic condition continue all antihypertensives adding additional hydrochlorothiazide to max out this dosage.  Return if symptoms worsen or fail to improve.

## 2014-09-30 LAB — URINALYSIS, ROUTINE W REFLEX MICROSCOPIC
Bilirubin Urine: NEGATIVE
GLUCOSE, UA: NEGATIVE mg/dL
HGB URINE DIPSTICK: NEGATIVE
KETONES UR: NEGATIVE mg/dL
Leukocytes, UA: NEGATIVE
Nitrite: NEGATIVE
PH: 6 (ref 5.0–8.0)
Protein, ur: NEGATIVE mg/dL
Specific Gravity, Urine: 1.012 (ref 1.005–1.030)
Urobilinogen, UA: 0.2 mg/dL (ref 0.0–1.0)

## 2014-09-30 LAB — COMPLETE METABOLIC PANEL WITH GFR
ALK PHOS: 95 U/L (ref 39–117)
ALT: <8 U/L (ref 0–35)
AST: 16 U/L (ref 0–37)
Albumin: 4.1 g/dL (ref 3.5–5.2)
BUN: 17 mg/dL (ref 6–23)
CO2: 29 mEq/L (ref 19–32)
Calcium: 9.6 mg/dL (ref 8.4–10.5)
Chloride: 100 mEq/L (ref 96–112)
Creat: 0.82 mg/dL (ref 0.50–1.10)
GFR, EST NON AFRICAN AMERICAN: 60 mL/min
GFR, Est African American: 69 mL/min
Glucose, Bld: 95 mg/dL (ref 70–99)
Potassium: 4.5 mEq/L (ref 3.5–5.3)
Sodium: 141 mEq/L (ref 135–145)
Total Bilirubin: 0.2 mg/dL (ref 0.2–1.2)
Total Protein: 6.9 g/dL (ref 6.0–8.3)

## 2014-10-03 ENCOUNTER — Other Ambulatory Visit: Payer: Self-pay | Admitting: Family Medicine

## 2014-10-10 ENCOUNTER — Ambulatory Visit: Payer: Medicare Other | Admitting: Sports Medicine

## 2014-10-11 ENCOUNTER — Emergency Department
Admission: EM | Admit: 2014-10-11 | Discharge: 2014-10-11 | Disposition: A | Discharge status: Home / Self Care | Payer: Medicare Other | Source: Home / Self Care | Attending: Emergency Medicine | Admitting: Emergency Medicine

## 2014-10-11 ENCOUNTER — Encounter: Payer: Self-pay | Admitting: Emergency Medicine

## 2014-10-11 DIAGNOSIS — J449 Chronic obstructive pulmonary disease, unspecified: Secondary | ICD-10-CM

## 2014-10-11 DIAGNOSIS — J302 Other seasonal allergic rhinitis: Secondary | ICD-10-CM | POA: Diagnosis not present

## 2014-10-11 DIAGNOSIS — IMO0001 Reserved for inherently not codable concepts without codable children: Secondary | ICD-10-CM

## 2014-10-11 DIAGNOSIS — J0100 Acute maxillary sinusitis, unspecified: Secondary | ICD-10-CM

## 2014-10-11 MED ORDER — ONDANSETRON HCL 4 MG PO TABS: 4.0000 mg | ORAL_TABLET | Freq: Three times a day (TID) | ORAL | Status: DC | PRN

## 2014-10-11 MED ORDER — METHYLPREDNISOLONE ACETATE 40 MG/ML IJ SUSP
40.0000 mg | Freq: Once | INTRAMUSCULAR | Status: AC
  Administered 2014-10-11: 40 mg via INTRAMUSCULAR

## 2014-10-11 MED ORDER — AMOXICILLIN 500 MG PO TABS: ORAL_TABLET | ORAL | Status: DC

## 2014-10-11 MED ORDER — FLUTICASONE PROPIONATE 50 MCG/ACT NA SUSP: NASAL | Status: DC

## 2014-10-11 MED ORDER — AZITHROMYCIN 250 MG PO TABS: ORAL_TABLET | ORAL | Status: DC

## 2014-10-11 NOTE — ED Provider Notes (Signed)
CSN: [id number]     Arrival date & time 10/11/14  1352 History   First MD Initiated Contact with Patient 10/11/14 1406     Chief Complaint  Patient presents with  . Cough   Here with adult niece, and [place]on Gully, [name]. HPI  Angela Sloan is a 81 y.o. female who complains of onset of cold symptoms for several days. Her main concern is severe nasal congestion with discolored rhinorrhea.   Have been using over-the-counter treatment which helps a little bit. Followed by Dr. Ivan Anchors for COPD, baseline pulse ox 93% on room air. Using Stiolto Respimat, 2 inhalations daily. Last seen by Dr. Ivan Anchors 09/29/14. I questioned her about her breathing capacity, and she states that isstable and her occasional baseline shortness of breath is stable and has not worsened. No chills/sweats No documented Fever  +  Nasal congestion +  Discolored Post-nasal drainage No sinus pain/pressure Scratchy sore throat  + Mild, occasional cough No wheezing No chest congestion No hemoptysis No shortness of breath No pleuritic pain  No itchy/red eyes No earache  Positive nausea No vomiting No abdominal pain No diarrhea  No skin rashes +  Fatigue No myalgias No headache   I reviewed her chart. CBC, CMP, UA all within normal limits within the past month Past Medical History  Diagnosis Date  . Hypertension   . Hyperlipidemia    COPD neck pain, followed by Dr. Ivan Anchors and Dr. Benjamin Stain History reviewed. No pertinent past surgical history. Family History  Problem Relation Age of Onset  . Hypertension Brother    History  Substance Use Topics  . Smoking status: Never Smoker   . Smokeless tobacco: Not on file  . Alcohol Use: No   OB History    No data available     Review of Systems  All other systems reviewed and are negative.   Allergies  Codeine and Trazodone and nefazodone  Home Medications   Prior to Admission medications   Medication Sig Start Date End Date Taking? Authorizing Provider   AMBULATORY NON FORMULARY MEDICATION Hospice evaluation.  Dx: Dementia 10/04/13   Laren Boom, DO  amLODipine (NORVASC) 10 MG tablet Take 1 tablet (10 mg total) by mouth daily. 08/25/14   Laren Boom, DO  amoxicillin (AMOXIL) 500 MG tablet Take 1 twice a day X 10 days. (Antibiotic) 10/11/14   Lajean Manes, MD  atorvastatin (LIPITOR) 20 MG tablet TAKE ONE TABLET DAILY 11/18/13   Sean Hommel, DO  bimatoprost (LUMIGAN) 0.03 % ophthalmic solution 1 drop at bedtime.    Historical Provider, MD  budesonide-formoterol (SYMBICORT) 80-4.5 MCG/ACT inhaler Inhale 2 puffs into the lungs 2 (two) times daily. 09/29/14   Laren Boom, DO  celecoxib (CELEBREX) 100 MG capsule Take 1 capsule (100 mg total) by mouth 2 (two) times daily. 11/20/13   Sean Hommel, DO  cyclobenzaprine (FLEXERIL) 5 MG tablet TAKE ONE TABLET AT BEDTIME AS NEEDED FOR NECK PAIN 08/21/14   Laren Boom, DO  cycloSPORINE (RESTASIS) 0.05 % ophthalmic emulsion 1 drop 2 (two) times daily.    Historical Provider, MD  docusate sodium (COLACE) 100 MG capsule Take 100 mg by mouth 2 (two) times daily.    Historical Provider, MD  donepezil (ARICEPT) 5 MG tablet TAKE ONE TABLET AT BEDTIME TO PREVENT MEMORY LOSS 07/31/14   Sean Hommel, DO  dorzolamide-timolol (COSOPT) 22.3-6.8 MG/ML ophthalmic solution Place 1 drop into both eyes 2 (two) times daily.    Historical Provider, MD  fluticasone (FLONASE) 50 MCG/ACT nasal spray 1 or  2 sprays each nostril twice a day 10/11/14   Lajean Manes, MD  GARLIC PO Take by mouth.    Historical Provider, MD  hydrochlorothiazide (HYDRODIURIL) 25 MG tablet One tablet by mouth every morning for blood pressure control. Take in addition to Lisinopril-HCTZ 09/29/14 09/29/15  Sean Hommel, DO  lisinopril-hydrochlorothiazide (PRINZIDE,ZESTORETIC) 20-25 MG per tablet Take 1 tablet by mouth daily. 08/29/14   Sean Hommel, DO  LORazepam (ATIVAN) 1 MG tablet TAKE 1 OR 2 TABLETS BY MOUTH AS NEEDED for sleep 10/04/14   Laren Boom, DO  MELATONIN PO Take by  mouth.    Historical Provider, MD  meloxicam (MOBIC) 15 MG tablet Take 1 tablet (15 mg total) by mouth daily. 05/29/14   Laren Boom, DO  Multiple Vitamins-Minerals (ICAPS MV PO) Take by mouth once.    Historical Provider, MD  Omega-3 Fatty Acids (FISH OIL PO) Take by mouth.    Historical Provider, MD  ondansetron (ZOFRAN) 4 MG tablet Take 1 tablet (4 mg total) by mouth every 8 (eight) hours as needed. As needed for nausea 10/11/14   Lajean Manes, MD  sertraline (ZOLOFT) 100 MG tablet TAKE ONE TABLET DAILY TO help prevent anxiety 05/29/14   Laren Boom, DO  traMADol (ULTRAM) 50 MG tablet TAKE 1-2 TABLETS EVERY 8 HOURS AS NEEDED FOR PAIN 08/29/14   Sean Hommel, DO   BP 147/74 mmHg  Pulse 82  Temp(Src) 98.2 F (36.8 C) (Oral)  Resp 20  Ht  (1.626 m)  Wt 101 lb (45.813 kg)  BMI 17.33 kg/m2  SpO2 97% Physical Exam  Constitutional: She is oriented to person, place, and time. She appears well-developed and well-nourished. No distress.  HENT:  Head: Normocephalic and atraumatic.  Right Ear: Tympanic membrane, external ear and ear canal normal.  Left Ear: Tympanic membrane, external ear and ear canal normal.  Nose: Mucosal edema and rhinorrhea present. Right sinus exhibits maxillary sinus tenderness. Left sinus exhibits maxillary sinus tenderness.  Mouth/Throat: Oropharynx is clear and moist. No oral lesions. No oropharyngeal exudate.  Eyes: Right eye exhibits no discharge. Left eye exhibits no discharge. No scleral icterus.  Neck: Neck supple.  Cardiovascular: Normal rate, regular rhythm and normal heart sounds.   Pulmonary/Chest: Effort normal. No accessory muscle usage. No respiratory distress. She has no decreased breath sounds. She has no rhonchi. She has no rales.  No wheezes, except for late expiratory wheezes anteriorly on forced expiration.  Abdominal: She exhibits no distension. There is no tenderness.  Lymphadenopathy:    She has no cervical adenopathy.  Neurological: She is alert  and oriented to person, place, and time.  Skin: Skin is warm and dry.  Nursing note and vitals reviewed.  Pulse ox on room air 97%, which is better than 93% at her last visit to Dr. Ivan Anchors ED Course  Procedures (including critical care time) Labs Review Labs Reviewed - No data to display  Imaging Review No results found.   MDM   1. Acute maxillary sinusitis, recurrence not specified   2. Other seasonal allergic rhinitis   3. COPD bronchitis    Treatment options discussed, as well as risks, benefits, alternatives. Patient and niece voiced understanding and agreement with the following plans:  Depo-Medrol 40 IM See detailed Instructions in AVS, which were given to patient. Verbal instructions also given. Risks, benefits, and alternatives of treatment options discussed. Questions invited and answered. Patient and [name] voiced understanding and agreement with plans. Flonase Zofran by mouth when necessary nausea. For antibiotic, at  first I prescribed amoxicillin, then pharmacist called back stating that patient has a record of intolerance or allergy to Augmentin.--We don't know what the intolerance was, so I'm avoiding amoxicillin. Zithromax Z-PAK prescribed.  Follow-up with your primary care doctor in 7 days if not improving, or sooner if symptoms become worse. Precautions discussed. Red flags discussed.--ER if any red flag Questions invited and answered. They voiced understanding and agreement.   Lajean Manesavid Massey, MD 10/11/14 [phone]

## 2014-10-11 NOTE — ED Notes (Signed)
Patient states she has had a cough for 2 weeks and now has nasal congestion/fullness which makes breathing somewhat difficult; trouble sleeping. Denies fever. Feels somewhat shakey when walking; her niece/POA is with her and supporting her when walking.

## 2014-11-04 ENCOUNTER — Other Ambulatory Visit: Payer: Self-pay | Admitting: Family Medicine

## 2014-11-11 ENCOUNTER — Other Ambulatory Visit: Payer: Self-pay | Admitting: Family Medicine

## 2014-11-18 ENCOUNTER — Other Ambulatory Visit: Payer: Self-pay | Admitting: Family Medicine

## 2014-11-19 ENCOUNTER — Other Ambulatory Visit: Payer: Self-pay | Admitting: *Deleted

## 2014-11-19 DIAGNOSIS — I10 Essential (primary) hypertension: Secondary | ICD-10-CM

## 2014-11-19 MED ORDER — LISINOPRIL-HYDROCHLOROTHIAZIDE 20-25 MG PO TABS: 1.0000 | ORAL_TABLET | Freq: Every day | ORAL | Status: DC

## 2014-11-19 MED ORDER — HYDROCHLOROTHIAZIDE 25 MG PO TABS: ORAL_TABLET | ORAL | Status: DC

## 2014-12-03 ENCOUNTER — Other Ambulatory Visit: Payer: Self-pay | Admitting: Family Medicine

## 2014-12-16 ENCOUNTER — Other Ambulatory Visit: Payer: Self-pay | Admitting: Family Medicine

## 2014-12-17 NOTE — Telephone Encounter (Signed)
[facility],  [facility]

## 2014-12-30 ENCOUNTER — Other Ambulatory Visit: Payer: Self-pay | Admitting: Family Medicine

## 2014-12-30 NOTE — Telephone Encounter (Signed)
Angela Sloan, Rx placed in in-box ready for pickup/faxing.  

## 2015-01-15 ENCOUNTER — Other Ambulatory Visit: Payer: Self-pay | Admitting: Family Medicine

## 2015-01-15 ENCOUNTER — Other Ambulatory Visit: Payer: Self-pay | Admitting: *Deleted

## 2015-01-28 ENCOUNTER — Other Ambulatory Visit: Payer: Self-pay | Admitting: Family Medicine

## 2015-02-02 ENCOUNTER — Other Ambulatory Visit: Payer: Self-pay | Admitting: Family Medicine

## 2015-02-11 ENCOUNTER — Other Ambulatory Visit: Payer: Self-pay | Admitting: Family Medicine

## 2015-02-11 ENCOUNTER — Other Ambulatory Visit: Payer: Self-pay | Admitting: *Deleted

## 2015-02-23 ENCOUNTER — Other Ambulatory Visit: Payer: Self-pay | Admitting: Family Medicine

## 2015-03-03 ENCOUNTER — Ambulatory Visit (INDEPENDENT_AMBULATORY_CARE_PROVIDER_SITE_OTHER): Payer: Medicare Other | Admitting: Family Medicine

## 2015-03-03 ENCOUNTER — Encounter: Payer: Self-pay | Admitting: Family Medicine

## 2015-03-03 VITALS — BP 150/73 | HR 76 | Wt 112.0 lb

## 2015-03-03 DIAGNOSIS — R0982 Postnasal drip: Secondary | ICD-10-CM

## 2015-03-03 DIAGNOSIS — L989 Disorder of the skin and subcutaneous tissue, unspecified: Secondary | ICD-10-CM

## 2015-03-03 DIAGNOSIS — G4700 Insomnia, unspecified: Secondary | ICD-10-CM | POA: Diagnosis not present

## 2015-03-03 MED ORDER — MONTELUKAST SODIUM 10 MG PO TABS: 10.0000 mg | ORAL_TABLET | Freq: Every day | ORAL | Status: DC

## 2015-03-03 MED ORDER — TRIAMCINOLONE ACETONIDE 0.1 % EX CREA: 1.0000 "application " | TOPICAL_CREAM | Freq: Two times a day (BID) | CUTANEOUS | Status: DC

## 2015-03-03 MED ORDER — DONEPEZIL HCL 5 MG PO TABS: ORAL_TABLET | ORAL | Status: DC

## 2015-03-03 MED ORDER — HYDROCHLOROTHIAZIDE 25 MG PO TABS: ORAL_TABLET | ORAL | Status: DC

## 2015-03-03 MED ORDER — BUDESONIDE-FORMOTEROL FUMARATE 80-4.5 MCG/ACT IN AERO: 2.0000 | INHALATION_SPRAY | Freq: Two times a day (BID) | RESPIRATORY_TRACT | Status: DC

## 2015-03-03 MED ORDER — SERTRALINE HCL 100 MG PO TABS: ORAL_TABLET | ORAL | Status: DC

## 2015-03-03 MED ORDER — LISINOPRIL-HYDROCHLOROTHIAZIDE 20-25 MG PO TABS: 1.0000 | ORAL_TABLET | Freq: Every day | ORAL | Status: DC

## 2015-03-03 MED ORDER — CYCLOBENZAPRINE HCL 5 MG PO TABS: ORAL_TABLET | ORAL | Status: DC

## 2015-03-03 MED ORDER — LORAZEPAM 1 MG PO TABS: ORAL_TABLET | ORAL | Status: DC

## 2015-03-03 NOTE — Progress Notes (Signed)
CC: Angela Sloan is a 81 y.o. female is here for lesion on the nose   Subjective: HPI:  Complains of raspy voice with increased effort on having to keep her voice sound normal for the last couple of years. Seems to be worse the longer she talks. Worse first thing in the morning. Seems to improve if she coughs. Symptoms are mild to moderate in severity but she would like to finally have this addressed. Over-the-counter anti-histamines used to be helpful, now not so much. Doesn't seem to get better or worse when it comes to compliance with Symbicort. It occurs on a daily basis. She denies shortness of breath, wheezing, painful swallowing or nasal congestion.  Reports difficulty falling asleep unless she takes a dose of lorazepamnightly. She denies any recent falls or inability to do activities of daily living since I saw her last. She denies any confusion while awake.she reports restorative sleep provided she takes lorazepam before bed.  Red persistent scab on the right proximal aspect of her nose. It's been there for matter of years but recently has become more itchy. She tries to pick at it on a daily basis but it always grows back. If she gets aggressive with picking it will bleed. It does not cause any pain but does moderately itch. She believes that she had a wart removed here decades ago.  Her niece who helps manage her medications would like refills on all of the medications and also to have these for a 90 day supply and to have what the medication is for written on the bottle.   Review Of Systems Outlined In HPI  Past Medical History  Diagnosis Date  . Hypertension   . Hyperlipidemia     No past surgical history on file. Family History  Problem Relation Age of Onset  . Hypertension Brother     History   Social History  . Marital Status: Widowed    Spouse Name: N/A  . Number of Children: N/A  . Years of Education: N/A   Occupational History  . Not on file.   Social  History Main Topics  . Smoking status: Never Smoker   . Smokeless tobacco: Not on file  . Alcohol Use: No  . Drug Use: No  . Sexual Activity: No   Other Topics Concern  . Not on file   Social History Narrative     Objective: BP 150/73 mmHg  Pulse 76  Wt 112 lb (50.803 kg)  General: Alert and Oriented, No Acute Distress HEENT: Pupils equal, round, reactive to light. Conjunctivae clear.  External ears unremarkable, canals clear with intact TMs with appropriate landmarks.  Middle ear appears open without effusion. Pink inferior turbinates.  Moist mucous membranes, pharynx without inflammation nor lesions.  Neck supple without palpable lymphadenopathy nor abnormal masses. Lungs: Clear to auscultation bilaterally, no wheezing/ronchi/rales.  Comfortable work of breathing. Good air movement. Cardiac: Regular rate and rhythm. Normal S1/S2.  No murmurs, rubs, nor gallops.   Extremities: No peripheral edema.  Strong peripheral pulses.  Mental Status: No depression, anxiety, nor agitation. Skin: Warm and dry. 2 mm diameter flat erythematous waxy appearing lesion on the right side of the nose.  Assessment & Plan: Angela Sloan was seen today for lesion on the nose.  Diagnoses and all orders for this visit:  Insomnia  Post-nasal drip Orders: -     montelukast (SINGULAIR) 10 MG tablet; Take 1 tablet (10 mg total) by mouth at bedtime. To help with voice/allergies.  Skin lesion  of face  Other orders -     triamcinolone cream (KENALOG) 0.1 %; Apply 1 application topically 2 (two) times daily. Apply to irritated spot on nose twice a day for two weeks. -     cyclobenzaprine (FLEXERIL) 5 MG tablet; TAKE ONE TABLET AT BEDTIME AS NEEDED FOR NECK PAIN -     budesonide-formoterol (SYMBICORT) 80-4.5 MCG/ACT inhaler; Inhale 2 puffs into the lungs 2 (two) times daily. To help prevent shortness of breath or cough. -     donepezil (ARICEPT) 5 MG tablet; TAKE ONE TABLET AT BEDTIME TO PREVENT MEMORY LOSS -      hydrochlorothiazide (HYDRODIURIL) 25 MG tablet; One tablet by mouth every morning for blood pressure control. Take in addition to Lisinopril-HCTZ for blood pressure -     lisinopril-hydrochlorothiazide (PRINZIDE,ZESTORETIC) 20-25 MG per tablet; Take 1 tablet by mouth daily. To help blood pressure -     LORazepam (ATIVAN) 1 MG tablet; TAKE 1 OR 2 TABLETS BY MOUTH DAILY AS NEEDED FOR SLEEP -     sertraline (ZOLOFT) 100 MG tablet; TAKE ONE TABLET DAILY TO help prevent anxiety   Insomnia: Controlled continue as needed Ativan Postnasal drip: Discussed I believe this is the source of her hoarseness, start Singulair. Skin lesion of the face: Suspected flank seborrheic keratosis instructed to leave this alone with respect to picking and to begin using a small amount of triamcinolone cream for 2 weeks  Time was taken to go over all of her medications with both the patient and the knees.  25 minutes spent face-to-face during visit today of which at least 50% was counseling or coordinating care regarding: 1. Insomnia   2. Post-nasal drip   3. Skin lesion of face       Return in about 3 months (around 06/03/2015).

## 2015-03-24 ENCOUNTER — Other Ambulatory Visit: Payer: Self-pay | Admitting: Family Medicine

## 2015-03-25 NOTE — Telephone Encounter (Signed)
Mobic is not on current medication list. May have been taken off by mistake.

## 2015-04-20 ENCOUNTER — Encounter: Payer: Self-pay | Admitting: Sports Medicine

## 2015-04-20 ENCOUNTER — Ambulatory Visit (INDEPENDENT_AMBULATORY_CARE_PROVIDER_SITE_OTHER): Payer: Medicare Other | Admitting: Sports Medicine

## 2015-04-20 VITALS — BP 147/65 | HR 86 | Wt 111.0 lb

## 2015-04-20 DIAGNOSIS — M1611 Unilateral primary osteoarthritis, right hip: Secondary | ICD-10-CM | POA: Diagnosis not present

## 2015-04-20 NOTE — Assessment & Plan Note (Signed)
Nine-month response to previous right femoral acetabular joint injection, repeat right femoral acetabular joint injection as above. Return as needed.

## 2015-04-20 NOTE — Progress Notes (Signed)
  Subjective:    CC: Right hip osteoarthritis  HPI: This is a pleasant 81 year old female, I injected her right femoral acetabular joint 9 months ago and she returns today with recurrence of pain. She does desire repeat interventional treatment, pain is moderate, persistent, localized in the groin.  Past medical history, Surgical history, Family history not pertinant except as noted below, Social history, Allergies, and medications have been entered into the medical record, reviewed, and no changes needed.   Review of Systems: No fevers, chills, night sweats, weight loss, chest pain, or shortness of breath.   Objective:    General: Well Developed, well nourished, and in no acute distress.  Neuro: Alert and oriented x3, extra-ocular muscles intact, sensation grossly intact.  HEENT: Normocephalic, atraumatic, pupils equal round reactive to light, neck supple, no masses, no lymphadenopathy, thyroid nonpalpable.  Skin: Warm and dry, no rashes. Cardiac: Regular rate and rhythm, no murmurs rubs or gallops, no lower extremity edema.  Respiratory: Clear to auscultation bilaterally. Not using accessory muscles, speaking in full sentences.  Procedure: Real-time Ultrasound Guided Injection of right femoral acetabular joint Device: GE Logiq E  Verbal informed consent obtained.  Time-out conducted.  Noted no overlying erythema, induration, or other signs of local infection.  Skin prepped in a sterile fashion.  Local anesthesia: Topical Ethyl chloride.  With sterile technique and under real time ultrasound guidance:  Spinal needle advanced to the femoral head/neck junction, bone contacted and 2 mL kenalog 40, 4 mL lidocaine injected easily. Completed without difficulty  Pain immediately resolved suggesting accurate placement of the medication.  Advised to call if fevers/chills, erythema, induration, drainage, or persistent bleeding.  Images permanently stored and available for review in the  ultrasound unit.  Impression: Technically successful ultrasound guided injection.  Impression and Recommendations:

## 2015-04-24 ENCOUNTER — Telehealth: Payer: Self-pay | Admitting: *Deleted

## 2015-04-24 NOTE — Telephone Encounter (Signed)
Angela Sloan ( pt's POA) called and wants to know if pt could take (3) 1 mg tablets of melatonin to help her sleep. [name] states pt has been complaining of not being able to sleep well at night, however pt does nap on and off during the day. Pt did take 3 tablets of melatonin the other night and it helped. They want to know if this is safe to continue

## 2015-04-24 NOTE — Telephone Encounter (Signed)
Linda notified. 

## 2015-04-24 NOTE — Telephone Encounter (Signed)
This is very safe, she can take up to a total of  if ever needed.

## 2015-05-04 ENCOUNTER — Other Ambulatory Visit: Payer: Self-pay | Admitting: Family Medicine

## 2015-05-06 ENCOUNTER — Ambulatory Visit (INDEPENDENT_AMBULATORY_CARE_PROVIDER_SITE_OTHER): Payer: Medicare Other | Admitting: Osteopathic Medicine

## 2015-05-06 ENCOUNTER — Encounter: Payer: Self-pay | Admitting: Osteopathic Medicine

## 2015-05-06 ENCOUNTER — Other Ambulatory Visit: Payer: Self-pay | Admitting: *Deleted

## 2015-05-06 VITALS — BP 168/88 | HR 90 | Wt 111.0 lb

## 2015-05-06 DIAGNOSIS — R110 Nausea: Secondary | ICD-10-CM

## 2015-05-06 DIAGNOSIS — M542 Cervicalgia: Secondary | ICD-10-CM

## 2015-05-06 MED ORDER — KETOROLAC TROMETHAMINE 30 MG/ML IJ SOLN
30.0000 mg | Freq: Once | INTRAMUSCULAR | Status: AC
  Administered 2015-05-06: 30 mg via INTRAVENOUS

## 2015-05-06 MED ORDER — TRAMADOL HCL 50 MG PO TABS: 25.0000 mg | ORAL_TABLET | Freq: Four times a day (QID) | ORAL | Status: DC | PRN

## 2015-05-06 MED ORDER — MELOXICAM 15 MG PO TABS: 7.5000 mg | ORAL_TABLET | Freq: Every day | ORAL | Status: DC

## 2015-05-06 MED ORDER — DICLOFENAC SODIUM 1 % TD GEL: 2.0000 g | Freq: Four times a day (QID) | TRANSDERMAL | Status: DC

## 2015-05-06 MED ORDER — ONDANSETRON 4 MG PO TBDP: 4.0000 mg | ORAL_TABLET | Freq: Three times a day (TID) | ORAL | Status: DC | PRN

## 2015-05-06 MED ORDER — CYCLOBENZAPRINE HCL 5 MG PO TABS: ORAL_TABLET | ORAL | Status: DC

## 2015-05-06 MED ORDER — LORAZEPAM 1 MG PO TABS: ORAL_TABLET | ORAL | Status: DC

## 2015-05-06 NOTE — Progress Notes (Signed)
HPI: Angela Sloan is a 81 y.o. female who [facility] [place]  today for chief complaint of:  Chief Complaint  Patient presents with  . Acute Visit    neck pain( keeping her awake ), dry mouth, dull achy shoulder  & mid chest pain    . Location: upper neck . Quality: "pain, sharp" . Severity: severe . Duration: been going on several years however worse today, couldn't sleep last night . Timing: constant . Modifying factors: Tramadol 50mg , Goody powder  . Assoc signs/symptoms: not being able to sleep because of pain.   Also requesting refills on lorazepam, tramadol.   Past medical, social and family history reviewed: Past Medical History  Diagnosis Date  . Hypertension   . Hyperlipidemia    No past surgical history on file. Social History  Substance Use Topics  . Smoking status: Never Smoker   . Smokeless tobacco: Not on file  . Alcohol Use: No   Family History  Problem Relation Age of Onset  . Hypertension Brother     Current Outpatient Prescriptions  Medication Sig Dispense Refill  . bimatoprost (LUMIGAN) 0.03 % ophthalmic solution 1 drop at bedtime.    . budesonide-formoterol (SYMBICORT) 80-4.5 MCG/ACT inhaler Inhale 2 puffs into the lungs 2 (two) times daily. To help prevent shortness of breath or cough. 1 Inhaler 12  . cyclobenzaprine (FLEXERIL) 5 MG tablet TAKE ONE TABLET AT BEDTIME AS NEEDED FOR NECK PAIN 90 tablet 1  . cycloSPORINE (RESTASIS) 0.05 % ophthalmic emulsion 1 drop 2 (two) times daily.    Marland Kitchen. donepezil (ARICEPT) 5 MG tablet TAKE ONE TABLET AT BEDTIME TO PREVENT MEMORY LOSS 90 tablet 3  . dorzolamide-timolol (COSOPT) 22.3-6.8 MG/ML ophthalmic solution Place 1 drop into both eyes 2 (two) times daily.    . fluticasone (FLONASE) 50 MCG/ACT nasal spray 1 or 2 sprays each nostril twice a day 16 g 0  . GARLIC PO Take by mouth.    . hydrochlorothiazide (HYDRODIURIL) 25 MG tablet One tablet by mouth every morning for  blood pressure control. Take in addition to Lisinopril-HCTZ for blood pressure 90 tablet 1  . lisinopril-hydrochlorothiazide (PRINZIDE,ZESTORETIC) 20-25 MG per tablet Take 1 tablet by mouth daily. To help blood pressure 90 tablet 1  . LORazepam (ATIVAN) 1 MG tablet TAKE 1 OR 2 TABLETS BY MOUTH DAILY AS NEEDED FOR SLEEP 90 tablet 0  . MELATONIN PO Take 5 mg by mouth.     . meloxicam (MOBIC) 15 MG tablet TAKE ONE TABLET DAILY to prevent inflammation 30 tablet 5  . montelukast (SINGULAIR) 10 MG tablet Take 1 tablet (10 mg total) by mouth at bedtime. To help with voice/allergies. 90 tablet 1  . Multiple Vitamins-Minerals (ICAPS MV PO) Take by mouth once.    . Omega-3 Fatty Acids (FISH OIL PO) Take by mouth.    . sertraline (ZOLOFT) 100 MG tablet TAKE ONE TABLET DAILY TO help prevent anxiety 90 tablet 1  . traMADol (ULTRAM) 50 MG tablet Take by mouth every 6 (six) hours as needed (PATIENT TAKE 1/2 TAB TWICE A DAY).    Marland Kitchen. triamcinolone cream (KENALOG) 0.1 % Apply 1 application topically 2 (two) times daily. Apply to irritated spot on nose twice a day for two weeks. 30 g 1   No current facility-administered medications for this visit.   Allergies  Allergen Reactions  . Codeine   . Trazodone And Nefazodone     nightmares      Review of Systems:  CONSTITUTIONAL: Neg fever/chills, no unintentional weight changes HEAD/EYES/EARS/NOSE/THROAT: No headache/vision change or hearing change, no sore throat CARDIAC: No chest pain/pressure RESPIRATORY: No cough/shortness of breath/wheeze GASTROINTESTINAL: (+) nausea, no vomiting/abdominal pain MUSCULOSKELETAL: Neck pain as per history of present illness  SKIN: No rash/wounds/concerning lesions NEUROLOGIC: No weakness/dizzines/slurred speech, no numbness or shooting pain in arms, no unilateral weakness, no changes in vision. Patient does have chronic,  PSYCHIATRIC: Sleep problems due to pain, patient is very distraught because of the pain   Exam:  BP  168/88 mmHg  Pulse 90  Wt 111 lb (50.349 kg)  SpO2 94% Constitutional: VSS, see above. General Appearance: alert, mild to moderate distress due to pain, patient is tearful.  Eyes: Normal lids and conjunctive, non-icteric sclera, PERRLA, EOMI  Ears, Nose, Mouth, Throat: Normal external inspection ears/nares/mouth/lips/gums Neck: No masses, trachea midline. No thyroid enlargement/tenderness/mass appreciated Respiratory: Normal respiratory effort. no wheeze/rhonchi/rales Cardiovascular: S1/S2 normal, no murmur/rub/gallop auscultated. RRR.  Musculoskeletal: Gait normal. No clubbing/cyanosis of digits. Strength 5 out of 5 in all 4 extremities, positive paraspinal tenderness on cervical region, no midline tenderness, muscle spasm on right side of neck, range of motion full to left and right rotation, flexion, extension is painful. Spurling sign negative bilaterally. Neurological: No cranial nerve deficit on limited exam. Motor and sensation intact and symmetric, cerebellar reflexes intact, negative Rovsing's  Psychiatric: Normal judgment/insight. Normal mood and affect. Oriented x3.  Skin: Warm dry and intact, no significant new rashes   No results found for this or any previous visit (from the past 72 hour(s)).    ASSESSMENT/PLAN:  Cervicalgia - Plan: traMADol (ULTRAM) 50 MG tablet, cyclobenzaprine (FLEXERIL) 5 MG tablet, meloxicam (MOBIC) 15 MG tablet, diclofenac sodium (VOLTAREN) 1 % GEL, MR Cervical Spine Wo Contrast, ketorolac (TORADOL) 30 MG/ML injection 30 mg  Nausea without vomiting - Plan: ondansetron (ZOFRAN-ODT) 4 MG disintegrating tablet    We'll refill patient's tramadol, Flexeril, clonazepam. Advised treatment with NSAIDs by mouth meloxicam as well as gel diclofenac. Advised try to control pain with NSAIDs, given the risk of side effects in elderly with the other above medications, patient's caregiver is advised of side effects particularly in elderly patients, she is also advised  along with the patient of stroke precautions and meningeal signs. Advised to go to the emergency room if this pain worsens, if any weakness or slurred speech or facial droop developed, if any vision changes or if any other concerns. They're also concerned since the patient fell in August, requests further imaging of neck, will reorder MRI, previous MRI results reviewed and there is some mild stenosis and disc abnormality, patient may benefit from referral to pain management, however, that she follow-up with Dr. Karie Schwalbe who she has seen before for neck pain.   Patient has been educated on significant possible side effects of medication and is instructed to contact me or other medical professional with any concerns about side effects.

## 2015-05-07 ENCOUNTER — Ambulatory Visit: Payer: Medicare Other | Admitting: Osteopathic Medicine

## 2015-05-11 ENCOUNTER — Ambulatory Visit (INDEPENDENT_AMBULATORY_CARE_PROVIDER_SITE_OTHER): Payer: Medicare Other

## 2015-05-11 DIAGNOSIS — M542 Cervicalgia: Secondary | ICD-10-CM | POA: Diagnosis not present

## 2015-05-19 ENCOUNTER — Other Ambulatory Visit: Payer: Self-pay | Admitting: Family Medicine

## 2015-05-22 ENCOUNTER — Other Ambulatory Visit: Payer: Self-pay | Admitting: Osteopathic Medicine

## 2015-05-22 NOTE — Telephone Encounter (Signed)
Is this refill appropriate? Thanks.

## 2015-05-22 NOTE — Telephone Encounter (Signed)
Angela FickLinda Sloan is [name] power of attorney and stated that the tramadol that [name] had prescribed on the 12th of this month is missing and they can't find it.  She wants to know can you send a new Rx for this?

## 2015-05-25 NOTE — Telephone Encounter (Signed)
It appears Dr. Lyn HollingsheadAlexander refilled this last week.

## 2015-05-29 ENCOUNTER — Other Ambulatory Visit: Payer: Self-pay | Admitting: Family Medicine

## 2015-06-03 ENCOUNTER — Ambulatory Visit (INDEPENDENT_AMBULATORY_CARE_PROVIDER_SITE_OTHER): Payer: Medicare Other | Admitting: Sports Medicine

## 2015-06-03 ENCOUNTER — Encounter: Payer: Self-pay | Admitting: Sports Medicine

## 2015-06-03 VITALS — BP 134/65 | HR 74 | Wt 113.0 lb

## 2015-06-03 DIAGNOSIS — M542 Cervicalgia: Secondary | ICD-10-CM | POA: Diagnosis not present

## 2015-06-03 DIAGNOSIS — M47812 Spondylosis without myelopathy or radiculopathy, cervical region: Secondary | ICD-10-CM

## 2015-06-03 MED ORDER — TRAMADOL HCL 50 MG PO TABS: 50.0000 mg | ORAL_TABLET | Freq: Two times a day (BID) | ORAL | Status: DC | PRN

## 2015-06-03 MED ORDER — MELOXICAM 15 MG PO TABS: 15.0000 mg | ORAL_TABLET | Freq: Every day | ORAL | Status: DC

## 2015-06-03 NOTE — Progress Notes (Signed)
  Subjective:    CC: Neck pain  HPI: This is a pleasant 81 year old female, she has multilevel cervical spondylosis with spondylolisthesis, she was unable to tolerate physical therapy but was at equilibrium with tramadol twice a day meloxicam once a day. She recently had a fall, got another MRI that was essentially unchanged, and referred back to me for further evaluation and definitive treatment. She does remember being switched down to a half dose of tramadol and meloxicam and desires to go back up to the full dose.  Right hip osteo-arthritis: Doing extremity well since injection 2 months ago.  Past medical history, Surgical history, Family history not pertinant except as noted below, Social history, Allergies, and medications have been entered into the medical record, reviewed, and no changes needed.   Review of Systems: No fevers, chills, night sweats, weight loss, chest pain, or shortness of breath.   Objective:    General: Well Developed, well nourished, and in no acute distress.  Neuro: Alert and oriented x3, extra-ocular muscles intact, sensation grossly intact.  HEENT: Normocephalic, atraumatic, pupils equal round reactive to light, neck supple, no masses, no lymphadenopathy, thyroid nonpalpable.  Skin: Warm and dry, no rashes. Cardiac: Regular rate and rhythm, no murmurs rubs or gallops, no lower extremity edema.  Respiratory: Clear to auscultation bilaterally. Not using accessory muscles, speaking in full sentences. Neck: Negative spurling's Full neck range of motion with the exception of some mild kyphoscoliosis due to her age. Grip strength and sensation normal in bilateral hands Strength good C4 to T1 distribution No sensory change to C4 to T1 Reflexes normal  Impression and Recommendations:

## 2015-06-03 NOTE — Assessment & Plan Note (Signed)
At this point she is fairly stable, no changes in the subsequent MRI. She was unable to tolerate home health physical therapy. She is having an increase in pain with the decrease in her pain medications so we will increase again, meloxicam 15 mg daily and tramadol 2 pills daily. Return to see me on an as-needed basis.

## 2015-06-09 ENCOUNTER — Other Ambulatory Visit: Payer: Self-pay | Admitting: Family Medicine

## 2015-06-26 ENCOUNTER — Encounter: Payer: Self-pay | Admitting: Family Medicine

## 2015-06-26 ENCOUNTER — Ambulatory Visit (INDEPENDENT_AMBULATORY_CARE_PROVIDER_SITE_OTHER): Payer: Medicare Other | Admitting: Family Medicine

## 2015-06-26 VITALS — BP 133/67 | HR 72 | Wt 112.0 lb

## 2015-06-26 DIAGNOSIS — M47812 Spondylosis without myelopathy or radiculopathy, cervical region: Secondary | ICD-10-CM | POA: Diagnosis not present

## 2015-06-26 DIAGNOSIS — R05 Cough: Secondary | ICD-10-CM

## 2015-06-26 DIAGNOSIS — F411 Generalized anxiety disorder: Secondary | ICD-10-CM

## 2015-06-26 DIAGNOSIS — I10 Essential (primary) hypertension: Secondary | ICD-10-CM

## 2015-06-26 DIAGNOSIS — R059 Cough, unspecified: Secondary | ICD-10-CM

## 2015-06-26 MED ORDER — ESCITALOPRAM OXALATE 10 MG PO TABS: 10.0000 mg | ORAL_TABLET | Freq: Every day | ORAL | Status: DC

## 2015-06-26 MED ORDER — LOSARTAN POTASSIUM-HCTZ 50-12.5 MG PO TABS: 1.0000 | ORAL_TABLET | Freq: Every day | ORAL | Status: DC

## 2015-06-26 MED ORDER — LORAZEPAM 1 MG PO TABS: ORAL_TABLET | ORAL | Status: DC

## 2015-06-26 MED ORDER — HYDROCHLOROTHIAZIDE 25 MG PO TABS: ORAL_TABLET | ORAL | Status: DC

## 2015-06-26 NOTE — Progress Notes (Signed)
CC: Angela Sloan Bianca is a 81 y.o. female is here for Cough; Headache; and Anxiety   Subjective: HPI:  Follow-up essential hypertension: Outside blood pressures are in the normotensive range. Taking lisinopril-hydrochlorothiazide and additional hydrochlorothiazide. She's had a cough since I saw her last and this did not get any better with Singulair. Cough is nonproductive and dry. Denies chest pain shortness of breath orthopnea nor peripheral edema  Follow-up neck pain: She tells me that since increasing meloxicam she's had a significant improvement with her chronic posterior neck pain. She denies any radiation of the pain or any upper extremity sensory or motor disturbances.  Follow-up anxiety: She feels like she's anxious on a daily basis. She is not sure what she is anxious about but just nervousness. It's mild to moderate in severity. Nothing seems to make it better or worse. She denies depression or thoughts when harm self or others.     Review Of Systems Outlined In HPI  Past Medical History  Diagnosis Date  . Hypertension   . Hyperlipidemia     No past surgical history on file. Family History  Problem Relation Age of Onset  . Hypertension Brother     Social History   Social History  . Marital Status: Widowed    Spouse Name: N/A  . Number of Children: N/A  . Years of Education: N/A   Occupational History  . Not on file.   Social History Main Topics  . Smoking status: Never Smoker   . Smokeless tobacco: Not on file  . Alcohol Use: No  . Drug Use: No  . Sexual Activity: No   Other Topics Concern  . Not on file   Social History Narrative     Objective: BP 133/67 mmHg  Pulse 72  Wt 112 lb (50.803 kg)  General: Alert and Oriented, No Acute Distress HEENT: Pupils equal, round, reactive to light. Conjunctivae clear.  Moist mucous membranes Lungs: Clear to auscultation bilaterally, no wheezing/ronchi/rales.  Comfortable work of breathing. Good air  movement. Cardiac: Regular rate and rhythm. Normal S1/S2.  No murmurs, rubs, nor gallops.   Extremities: No peripheral edema.  Strong peripheral pulses.  Mental Status: No depression, anxiety, nor agitation. Skin: Warm and dry.  Assessment & Plan: [name] was seen today for cough, headache and anxiety.  Diagnoses and all orders for this visit:  Cervical spondylosis without myelopathy  Essential hypertension -     losartan-hydrochlorothiazide (HYZAAR) 50-12.5 MG tablet; Take 1 tablet by mouth daily. -     hydrochlorothiazide (HYDRODIURIL) 25 MG tablet; One tablet by mouth every morning for blood pressure control. Take in addition to Losartan-HCTZ for blood pressure  Cough  Generalized anxiety disorder  Other orders -     escitalopram (LEXAPRO) 10 MG tablet; Take 1 tablet (10 mg total) by mouth daily. -     LORazepam (ATIVAN) 1 MG tablet; TAKE 1 OR 2 TABLETS BY MOUTH EVERY DAY AS NEEDED FOR SLEEP   Neck pain: Controlled with tramadol and meloxicam Essential hypertension: Controlled however lisinopril could be causing cough therefore switching to losartan Anxiety: Controlled chronic condition switching from Zoloft to Lexapro, continue as needed Ativan for sleep Cough: Singular was no benefit stopping this medication  Return in about 3 months (around 09/24/2015).

## 2015-07-16 ENCOUNTER — Other Ambulatory Visit: Payer: Self-pay | Admitting: Osteopathic Medicine

## 2015-08-21 ENCOUNTER — Other Ambulatory Visit: Payer: Self-pay | Admitting: Osteopathic Medicine

## 2015-08-24 ENCOUNTER — Other Ambulatory Visit: Payer: Self-pay | Admitting: Family Medicine

## 2015-09-09 ENCOUNTER — Other Ambulatory Visit: Payer: Self-pay | Admitting: Family Medicine

## 2015-09-23 ENCOUNTER — Other Ambulatory Visit: Payer: Self-pay | Admitting: Family Medicine

## 2015-09-23 DIAGNOSIS — L821 Other seborrheic keratosis: Secondary | ICD-10-CM | POA: Diagnosis not present

## 2015-09-25 ENCOUNTER — Ambulatory Visit (INDEPENDENT_AMBULATORY_CARE_PROVIDER_SITE_OTHER): Payer: Medicare Other | Admitting: Sports Medicine

## 2015-09-25 ENCOUNTER — Encounter: Payer: Self-pay | Admitting: Sports Medicine

## 2015-09-25 ENCOUNTER — Encounter: Payer: Self-pay | Admitting: Family Medicine

## 2015-09-25 ENCOUNTER — Ambulatory Visit (INDEPENDENT_AMBULATORY_CARE_PROVIDER_SITE_OTHER): Payer: Medicare Other | Admitting: Family Medicine

## 2015-09-25 VITALS — BP 147/71 | HR 73

## 2015-09-25 DIAGNOSIS — M1711 Unilateral primary osteoarthritis, right knee: Secondary | ICD-10-CM | POA: Diagnosis not present

## 2015-09-25 DIAGNOSIS — F411 Generalized anxiety disorder: Secondary | ICD-10-CM | POA: Diagnosis not present

## 2015-09-25 DIAGNOSIS — I10 Essential (primary) hypertension: Secondary | ICD-10-CM | POA: Diagnosis not present

## 2015-09-25 DIAGNOSIS — M1611 Unilateral primary osteoarthritis, right hip: Secondary | ICD-10-CM

## 2015-09-25 MED ORDER — HYDROCHLOROTHIAZIDE 25 MG PO TABS: ORAL_TABLET | ORAL | Status: DC

## 2015-09-25 MED ORDER — LOSARTAN POTASSIUM-HCTZ 100-12.5 MG PO TABS: 1.0000 | ORAL_TABLET | Freq: Every day | ORAL | Status: DC

## 2015-09-25 NOTE — Progress Notes (Signed)
  Subjective:    CC: hip and knee pain  HPI: This is a pleasant 82 year old female, injected her right hip 6 months ago, she is having recurrence of pain in the groin, moderate, persistent without radiation and desires repeat interventional treatment today.  Right knee pain: Present chronically, localized at the joint lines, moderate, persistent, desires interventional treatment today.  Past medical history, Surgical history, Family history not pertinant except as noted below, Social history, Allergies, and medications have been entered into the medical record, reviewed, and no changes needed.   Review of Systems: No fevers, chills, night sweats, weight loss, chest pain, or shortness of breath.   Objective:    General: Well Developed, well nourished, and in no acute distress.  Neuro: Alert and oriented x3, extra-ocular muscles intact, sensation grossly intact.  HEENT: Normocephalic, atraumatic, pupils equal round reactive to light, neck supple, no masses, no lymphadenopathy, thyroid nonpalpable.  Skin: Warm and dry, no rashes. Cardiac: Regular rate and rhythm, no murmurs rubs or gallops, no lower extremity edema.  Respiratory: Clear to auscultation bilaterally. Not using accessory muscles, speaking in full sentences. Right Knee: Normal to inspection with no erythema or effusion or obvious bony abnormalities. Tender to palpation at the joint lines ROM normal in flexion and extension and lower leg rotation. Ligaments with solid consistent endpoints including ACL, PCL, LCL, MCL. Negative Mcmurray's and provocative meniscal tests. Non painful patellar compression. Patellar and quadriceps tendons unremarkable. Hamstring and quadriceps strength is normal.  Procedure: Real-time Ultrasound Guided Injection of right hip joint Device: GE Logiq E  Verbal informed consent obtained.  Time-out conducted.  Noted no overlying erythema, induration, or other signs of local infection.  Skin prepped  in a sterile fashion.  Local anesthesia: Topical Ethyl chloride.  With sterile technique and under real time ultrasound guidance:  Spinal needle advanced into the femoral head/neck junction, bone contacted and 1 mL kenalog 40, 2 mL lidocaine, 2 mL Marcaine injected easily. Completed without difficulty  Pain immediately resolved suggesting accurate placement of the medication.  Advised to call if fevers/chills, erythema, induration, drainage, or persistent bleeding.  Images permanently stored and available for review in the ultrasound unit.  Impression: Technically successful ultrasound guided injection.  Procedure: Real-time Ultrasound Guided Injection of right knee Device: GE Logiq E  Verbal informed consent obtained.  Time-out conducted.  Noted no overlying erythema, induration, or other signs of local infection.  Skin prepped in a sterile fashion.  Local anesthesia: Topical Ethyl chloride.  With sterile technique and under real time ultrasound guidance: 1 mL kenalog 40, 2 mL lidocaine, 2 mL Marcaine injected easily into the suprapatellar recess.  Completed without difficulty  Pain immediately resolved suggesting accurate placement of the medication.  Advised to call if fevers/chills, erythema, induration, drainage, or persistent bleeding.  Images permanently stored and available for review in the ultrasound unit.  Impression: Technically successful ultrasound guided injection.  Impression and Recommendations:

## 2015-09-25 NOTE — Assessment & Plan Note (Signed)
Injection as above, return in one month. 

## 2015-09-25 NOTE — Progress Notes (Signed)
CC: Angela Sloan is a 82 y.o. female is here for Hypertension and Anxiety   Subjective: HPI:  Follow-up essential hypertension: Since stopping lisinopril her cough has almost entirely gone away. She denies any pulmonary complaints today. Denies chest pain shortness of breath orthopnea and peripheral edema.  Follow-up anxiety: Since switching from Zoloft to Lexapro she's noticed improvement which is generalized anxiety. She denies any depression or side effects. She denies any mental disturbance or difficulty with sleep.   Review Of Systems Outlined In HPI  Past Medical History  Diagnosis Date  . Hypertension   . Hyperlipidemia     No past surgical history on file. Family History  Problem Relation Age of Onset  . Hypertension Brother     Social History   Social History  . Marital Status: Widowed    Spouse Name: N/A  . Number of Children: N/A  . Years of Education: N/A   Occupational History  . Not on file.   Social History Main Topics  . Smoking status: Never Smoker   . Smokeless tobacco: Not on file  . Alcohol Use: No  . Drug Use: No  . Sexual Activity: No   Other Topics Concern  . Not on file   Social History Narrative     Objective: BP 147/71 mmHg  Pulse 73  Wt   General: Alert and Oriented, No Acute Distress HEENT: Pupils equal, round, reactive to light. Conjunctivae clear.  Moist mucous membranes Lungs: Clear to auscultation bilaterally, no wheezing/ronchi/rales.  Comfortable work of breathing. Good air movement. Cardiac: Regular rate and rhythm. Normal S1/S2.  No murmurs, rubs, nor gallops.   Extremities: No peripheral edema.  Strong peripheral pulses.  Mental Status: No depression, anxiety, nor agitation. Skin: Warm and dry.  Assessment & Plan: Angela Sloan was seen today for hypertension and anxiety.  Diagnoses and all orders for this visit:  Essential hypertension, benign -     Discontinue: hydrochlorothiazide (HYDRODIURIL) 25 MG tablet; TAKE 1  TABLET BY MOUTH EVERY MORNING -     losartan-hydrochlorothiazide (HYZAAR) 100-12.5 MG tablet; Take 1 tablet by mouth daily. -     COMPLETE METABOLIC PANEL WITH GFR  Generalized anxiety disorder  Essential hypertension   Essential hypertension: Uncontrolled chronic condition increasing losartan component of her combination antihypertensive. Checking renal function as well as been almost a year now since it was checked last Generalized anxiety disorder: Stable with Lexapro.  Return in about 3 months (around 12/26/2015) for BP.

## 2015-09-25 NOTE — Assessment & Plan Note (Signed)
Six-month response to prior right hip joint injection, repeated today.

## 2015-09-26 LAB — COMPLETE METABOLIC PANEL WITH GFR
ALBUMIN: 4 g/dL (ref 3.6–5.1)
ALK PHOS: 68 U/L (ref 33–130)
ALT: 10 U/L (ref 6–29)
AST: 22 U/L (ref 10–35)
BUN: 26 mg/dL — AB (ref 7–25)
CALCIUM: 8.9 mg/dL (ref 8.6–10.4)
CHLORIDE: 105 mmol/L (ref 98–110)
CO2: 23 mmol/L (ref 20–31)
Creat: 1.23 mg/dL — ABNORMAL HIGH (ref 0.60–0.88)
GFR, EST AFRICAN AMERICAN: 42 mL/min — AB (ref >=60)
GFR, EST NON AFRICAN AMERICAN: 36 mL/min — AB (ref >=60)
Glucose, Bld: 103 mg/dL — ABNORMAL HIGH (ref 65–99)
POTASSIUM: 3.7 mmol/L (ref 3.5–5.3)
Sodium: 142 mmol/L (ref 135–146)
Total Bilirubin: 0.2 mg/dL (ref 0.2–1.2)
Total Protein: 6.7 g/dL (ref 6.1–8.1)

## 2015-09-28 ENCOUNTER — Telehealth: Payer: Self-pay | Admitting: Family Medicine

## 2015-09-28 MED ORDER — AMLODIPINE BESYLATE 10 MG PO TABS: 10.0000 mg | ORAL_TABLET | Freq: Every day | ORAL | Status: AC

## 2015-09-28 NOTE — Telephone Encounter (Signed)
Will you please let patient/family know that her kidney function appears to be reduced due to her current losartan BP medication regimen and I"d recommend stopping this medication and switching to a different medication called amlodipine that I've sent to her pharmacy.  I'd recommend f/u in three months.

## 2015-09-29 NOTE — Telephone Encounter (Signed)
I'd blame the headache on the blood pressure being oddly elevated, she's tolerated meloxicam in the past without any problems. I think it's safe to continue this medication.

## 2015-09-29 NOTE — Telephone Encounter (Signed)
Daughter notified.  She stated that on Sunday Angela Sloan BP was 190/91 with a headache and she was then giving 2 losartan pills. She wants to know did the headache come from her BP being so high or is it being caused by meloxicam and should she continue to take that medication.

## 2015-09-29 NOTE — Telephone Encounter (Signed)
Daughter notified 

## 2015-09-29 NOTE — Telephone Encounter (Signed)
Left vm telling pt to call for results

## 2015-10-12 ENCOUNTER — Other Ambulatory Visit: Payer: Self-pay | Admitting: Family Medicine

## 2015-10-12 DIAGNOSIS — H401134 Primary open-angle glaucoma, bilateral, indeterminate stage: Secondary | ICD-10-CM | POA: Diagnosis not present

## 2015-10-12 DIAGNOSIS — H527 Unspecified disorder of refraction: Secondary | ICD-10-CM | POA: Diagnosis not present

## 2015-10-12 DIAGNOSIS — Z961 Presence of intraocular lens: Secondary | ICD-10-CM | POA: Diagnosis not present

## 2015-10-12 DIAGNOSIS — H16223 Keratoconjunctivitis sicca, not specified as Sjogren's, bilateral: Secondary | ICD-10-CM | POA: Diagnosis not present

## 2015-10-19 DIAGNOSIS — M1711 Unilateral primary osteoarthritis, right knee: Secondary | ICD-10-CM | POA: Diagnosis not present

## 2015-10-19 DIAGNOSIS — M25561 Pain in right knee: Secondary | ICD-10-CM | POA: Diagnosis not present

## 2015-11-05 ENCOUNTER — Other Ambulatory Visit: Payer: Self-pay | Admitting: Family Medicine

## 2015-11-09 ENCOUNTER — Other Ambulatory Visit: Payer: Self-pay | Admitting: Family Medicine

## 2015-11-11 ENCOUNTER — Other Ambulatory Visit: Payer: Self-pay | Admitting: Family Medicine

## 2015-11-11 NOTE — Telephone Encounter (Signed)
Is this refill appropriate?  

## 2015-11-13 ENCOUNTER — Other Ambulatory Visit: Payer: Self-pay | Admitting: Family Medicine

## 2015-12-09 ENCOUNTER — Other Ambulatory Visit: Payer: Self-pay | Admitting: Family Medicine

## 2015-12-09 DIAGNOSIS — M1711 Unilateral primary osteoarthritis, right knee: Secondary | ICD-10-CM | POA: Diagnosis not present

## 2015-12-09 DIAGNOSIS — M25551 Pain in right hip: Secondary | ICD-10-CM | POA: Diagnosis not present

## 2015-12-09 NOTE — Telephone Encounter (Signed)
This medication was D/C last year. Please advise.

## 2015-12-09 NOTE — Telephone Encounter (Signed)
If she wants a refill it's fine with me

## 2015-12-14 ENCOUNTER — Telehealth: Payer: Self-pay

## 2015-12-14 DIAGNOSIS — R110 Nausea: Secondary | ICD-10-CM

## 2015-12-14 MED ORDER — ONDANSETRON 4 MG PO TBDP: 4.0000 mg | ORAL_TABLET | Freq: Three times a day (TID) | ORAL | Status: AC | PRN

## 2015-12-14 NOTE — Telephone Encounter (Signed)
Patient request refill for Zofran. Ladarrious Kirksey,CMA

## 2015-12-25 ENCOUNTER — Other Ambulatory Visit: Payer: Self-pay | Admitting: Family Medicine

## 2015-12-28 ENCOUNTER — Encounter: Payer: Self-pay | Admitting: Family Medicine

## 2015-12-28 ENCOUNTER — Ambulatory Visit (INDEPENDENT_AMBULATORY_CARE_PROVIDER_SITE_OTHER): Payer: Medicare Other | Admitting: Family Medicine

## 2015-12-28 VITALS — BP 158/75 | HR 76 | Wt 118.0 lb

## 2015-12-28 DIAGNOSIS — G4700 Insomnia, unspecified: Secondary | ICD-10-CM

## 2015-12-28 DIAGNOSIS — M1990 Unspecified osteoarthritis, unspecified site: Secondary | ICD-10-CM | POA: Diagnosis not present

## 2015-12-28 DIAGNOSIS — I10 Essential (primary) hypertension: Secondary | ICD-10-CM

## 2015-12-28 DIAGNOSIS — N183 Chronic kidney disease, stage 3 unspecified: Secondary | ICD-10-CM | POA: Insufficient documentation

## 2015-12-28 MED ORDER — HYDROCHLOROTHIAZIDE 25 MG PO TABS: ORAL_TABLET | ORAL | Status: AC

## 2015-12-28 MED ORDER — CELECOXIB 200 MG PO CAPS: 200.0000 mg | ORAL_CAPSULE | Freq: Two times a day (BID) | ORAL | Status: AC

## 2015-12-28 MED ORDER — ZOLPIDEM TARTRATE 5 MG PO TABS: 2.5000 mg | ORAL_TABLET | Freq: Every evening | ORAL | Status: DC | PRN

## 2015-12-28 NOTE — Patient Instructions (Signed)
Do not take take lorazepam at bedtime for the next seven days to see if Ambien alone helps with your insomnia.

## 2015-12-28 NOTE — Progress Notes (Signed)
CC: Angela Sloan is a 82 y.o. female is here for Hypertension   Subjective: HPI:  Follow essential hypertension: She is taking amlodipine and hydrochlorothiazide but no outside blood pressures report. She denies chest pain shortness of breath but has had some swelling in the ankles which came on soon after taking amlodipine. She denies any asymmetry. She denies any pain in the ankles. It is persistent and does not change in severity throughout the day or day today. She denies any skin changes.  Insomnia: She is having difficulty with falling asleep most nights of the week. She denies any pain or anxiety contributing to this. She's taking lorazepam to try to help sleep but doesn't seem to be working anymore. She denies any urinary complaints contribute to her difficulty falling asleep.  She tells me that her right knee has been bothering her for matter of months now. It has not responded to steroid injection here with Dr. Karie Schwalbe or injections with Dr. pill. Meloxicam does not seem to be helping, tramadol and also pain slightly. She denies any mechanical symptoms, swelling redness or warmth of the knee   Review Of Systems Outlined In HPI  Past Medical History  Diagnosis Date  . Hypertension   . Hyperlipidemia     No past surgical history on file. Family History  Problem Relation Age of Onset  . Hypertension Brother     Social History   Social History  . Marital Status: Widowed    Spouse Name: N/A  . Number of Children: N/A  . Years of Education: N/A   Occupational History  . Not on file.   Social History Main Topics  . Smoking status: Never Smoker   . Smokeless tobacco: Not on file  . Alcohol Use: No  . Drug Use: No  . Sexual Activity: No   Other Topics Concern  . Not on file   Social History Narrative     Objective: BP 158/75 mmHg  Pulse 76  Wt 118 lb (53.524 kg)  General: Alert and Oriented, No Acute Distress HEENT: Pupils equal, round, reactive to light.  Conjunctivae clear.  Moist mucous membranes Lungs: Clear to auscultation bilaterally, no wheezing/ronchi/rales.  Comfortable work of breathing. Good air movement. Cardiac: Regular rate and rhythm. Normal S1/S2.  No murmurs, rubs, nor gallops.   Extremities: 1+ peripheral edema affecting only the ankles.  Strong peripheral pulses.  No swelling redness or warmth of the right knee Mental Status: No depression, anxiety, nor agitation. Skin: Warm and dry.  Assessment & Plan: Synia was seen today for hypertension.  Diagnoses and all orders for this visit:  Essential hypertension, benign -     BASIC METABOLIC PANEL WITH GFR  Chronic renal disease, stage 3, moderately decreased glomerular filtration rate between 30-59 mL/min/1.73 square meter  Insomnia -     zolpidem (AMBIEN) 5 MG tablet; Take 0.5-1 tablets (2.5-5 mg total) by mouth at bedtime as needed for sleep.  Osteoarthritis, unspecified osteoarthritis type, unspecified site -     celecoxib (CELEBREX) 200 MG capsule; Take 1 capsule (200 mg total) by mouth 2 (two) times daily. For arthritis.  Essential hypertension -     hydrochlorothiazide (HYDRODIURIL) 25 MG tablet; One by mouth daily for blood pressure.   Essential hypertension: Like to obtain a basic metabolic panel to determine that her decreased kidney function is not worsening and the cause of her ankle edema. Continue amlodipine and Hydrocort Dyazide pending results Insomnia: Switching from lorazepam to Ambien Osteoarthritis: Stop meloxicam begin Celebrex  .  Return in about 3 months (around 03/29/2016) for Sleep and BP.

## 2015-12-29 ENCOUNTER — Telehealth: Payer: Self-pay

## 2015-12-29 LAB — BASIC METABOLIC PANEL WITH GFR
BUN: 18 mg/dL (ref 7–25)
CALCIUM: 9.3 mg/dL (ref 8.6–10.4)
CO2: 31 mmol/L (ref 20–31)
CREATININE: 0.83 mg/dL (ref 0.60–0.88)
Chloride: 98 mmol/L (ref 98–110)
GFR, EST AFRICAN AMERICAN: 67 mL/min (ref >=60)
GFR, Est Non African American: 58 mL/min — ABNORMAL LOW (ref >=60)
Glucose, Bld: 89 mg/dL (ref 65–99)
Potassium: 3.7 mmol/L (ref 3.5–5.3)
Sodium: 138 mmol/L (ref 135–146)

## 2015-12-29 MED ORDER — SUVOREXANT 10 MG PO TABS: 1.0000 | ORAL_TABLET | Freq: Every day | ORAL | Status: DC

## 2015-12-29 NOTE — Telephone Encounter (Signed)
Niece notified and reports that she counted Angela Sloan pills and it looks like she had taken 2 Ambien last night but agrees to with her stopping medications. This is just an [place].

## 2015-12-29 NOTE — Telephone Encounter (Signed)
Unfortunately yes Angela Sloan is probably the cause of this since she was in such a normal state yesterday when we met.  I'd recommend stopping this medication and switching to a different medication called Belosmra that has much less chance of causing psychologic side effects. (free trial voucher in your in box).  It's important to stress that this medication may take up to a week before it fully kicks in.

## 2015-12-29 NOTE — Telephone Encounter (Signed)
Mrs. Angela Sloan niece reports when Sandquist woke up this morning she was delusional and she wants to know do ambein have anything to do with this.  Please advise.

## 2016-01-01 ENCOUNTER — Other Ambulatory Visit: Payer: Self-pay | Admitting: Family Medicine

## 2016-01-07 ENCOUNTER — Telehealth: Payer: Self-pay | Admitting: Family Medicine

## 2016-01-07 MED ORDER — SUVOREXANT 10 MG PO TABS
1.0000 | ORAL_TABLET | Freq: Every day | ORAL | Status: DC
Start: 2016-01-07 — End: 2016-01-13

## 2016-01-07 NOTE — Telephone Encounter (Signed)
Patient's daughter walked-in request to have a script  For Balsomra 10mg  to [facility]

## 2016-01-07 NOTE — Telephone Encounter (Signed)
[place], Rx placed in in-box ready for pickup/faxing.  

## 2016-01-07 NOTE — Telephone Encounter (Signed)
Rx faxed

## 2016-01-11 ENCOUNTER — Other Ambulatory Visit: Payer: Self-pay | Admitting: Family Medicine

## 2016-01-12 ENCOUNTER — Other Ambulatory Visit: Payer: Self-pay

## 2016-01-12 DIAGNOSIS — M159 Polyosteoarthritis, unspecified: Secondary | ICD-10-CM

## 2016-01-12 MED ORDER — LORAZEPAM 1 MG PO TABS: ORAL_TABLET | ORAL | Status: AC

## 2016-01-12 MED ORDER — TRAMADOL HCL 50 MG PO TABS: ORAL_TABLET | ORAL | Status: AC

## 2016-01-12 NOTE — Telephone Encounter (Signed)
Pt moved into [facility] today. They are asking for medication orders. I've changed the sig on tramadol to take 1 tab q12h for pain.  They want to change the sig on ativan as well to 1 tab at night for sleep when the oringal sig was take 1-2 tab qd prn for sleep. Mrs. Lamphear niece said that she sometimes takes 2. They are also asking for an order for goodie powder prn.  Mrs. Thedford niece reports that she have been complaining of arthritis pain and wants to know could she be referred to a pain management clinic. Please advise.

## 2016-01-12 NOTE — Telephone Encounter (Signed)
[place], Rx placed in in-box ready for pickup/faxing. And pain management referral has been placed.

## 2016-01-13 ENCOUNTER — Other Ambulatory Visit: Payer: Self-pay

## 2016-01-13 DIAGNOSIS — L603 Nail dystrophy: Secondary | ICD-10-CM | POA: Diagnosis not present

## 2016-01-13 MED ORDER — SUVOREXANT 10 MG PO TABS: 1.0000 | ORAL_TABLET | Freq: Every day | ORAL | Status: AC

## 2016-01-13 NOTE — Telephone Encounter (Signed)
Yes she can take both belsomra and ativan to help with sleep, I'll print off a new month long Rx since the original was just for a trial period.

## 2016-01-13 NOTE — Telephone Encounter (Signed)
Is it ok for pt to take both ativan and belsomra for sleep.  If so how many belsomra to Rx each month. Please advise.

## 2016-01-14 NOTE — Telephone Encounter (Signed)
rx faxed to [facility]

## 2016-01-15 ENCOUNTER — Telehealth: Payer: Self-pay

## 2016-01-15 ENCOUNTER — Other Ambulatory Visit: Payer: Self-pay | Admitting: Family Medicine

## 2016-01-15 DIAGNOSIS — M1611 Unilateral primary osteoarthritis, right hip: Secondary | ICD-10-CM | POA: Diagnosis not present

## 2016-01-15 DIAGNOSIS — E538 Deficiency of other specified B group vitamins: Secondary | ICD-10-CM | POA: Diagnosis not present

## 2016-01-15 DIAGNOSIS — Z9181 History of falling: Secondary | ICD-10-CM | POA: Diagnosis not present

## 2016-01-15 DIAGNOSIS — J449 Chronic obstructive pulmonary disease, unspecified: Secondary | ICD-10-CM | POA: Diagnosis not present

## 2016-01-15 DIAGNOSIS — N183 Chronic kidney disease, stage 3 (moderate): Secondary | ICD-10-CM | POA: Diagnosis not present

## 2016-01-15 DIAGNOSIS — M47812 Spondylosis without myelopathy or radiculopathy, cervical region: Secondary | ICD-10-CM | POA: Diagnosis not present

## 2016-01-15 DIAGNOSIS — I129 Hypertensive chronic kidney disease with stage 1 through stage 4 chronic kidney disease, or unspecified chronic kidney disease: Secondary | ICD-10-CM | POA: Diagnosis not present

## 2016-01-15 NOTE — Telephone Encounter (Signed)
Eulogio BearKathy Sullivan with [facility] called and would like verbal orders to add Tylenol for breakthrough pain and a stool softener for constipation.   Olegario MessierKathy # [phone]

## 2016-01-15 NOTE — Telephone Encounter (Signed)
Kathy advised.

## 2016-01-15 NOTE — Telephone Encounter (Signed)
Verbal left on vm

## 2016-01-15 NOTE — Telephone Encounter (Signed)
[place], can you please relay:  Tylenol 500-1000mg  every eight hours as needed for pain and stool softener daily as needed for constipation.

## 2016-01-18 DIAGNOSIS — H401132 Primary open-angle glaucoma, bilateral, moderate stage: Secondary | ICD-10-CM | POA: Diagnosis not present

## 2016-01-18 DIAGNOSIS — N183 Chronic kidney disease, stage 3 (moderate): Secondary | ICD-10-CM | POA: Diagnosis not present

## 2016-01-18 DIAGNOSIS — M1611 Unilateral primary osteoarthritis, right hip: Secondary | ICD-10-CM | POA: Diagnosis not present

## 2016-01-18 DIAGNOSIS — H16223 Keratoconjunctivitis sicca, not specified as Sjogren's, bilateral: Secondary | ICD-10-CM | POA: Diagnosis not present

## 2016-01-18 DIAGNOSIS — Z961 Presence of intraocular lens: Secondary | ICD-10-CM | POA: Diagnosis not present

## 2016-01-18 DIAGNOSIS — Z9181 History of falling: Secondary | ICD-10-CM | POA: Diagnosis not present

## 2016-01-18 DIAGNOSIS — J449 Chronic obstructive pulmonary disease, unspecified: Secondary | ICD-10-CM | POA: Diagnosis not present

## 2016-01-18 DIAGNOSIS — I129 Hypertensive chronic kidney disease with stage 1 through stage 4 chronic kidney disease, or unspecified chronic kidney disease: Secondary | ICD-10-CM | POA: Diagnosis not present

## 2016-01-18 DIAGNOSIS — E538 Deficiency of other specified B group vitamins: Secondary | ICD-10-CM | POA: Diagnosis not present

## 2016-01-18 DIAGNOSIS — H527 Unspecified disorder of refraction: Secondary | ICD-10-CM | POA: Diagnosis not present

## 2016-01-18 DIAGNOSIS — M47812 Spondylosis without myelopathy or radiculopathy, cervical region: Secondary | ICD-10-CM | POA: Diagnosis not present

## 2016-01-19 DIAGNOSIS — I129 Hypertensive chronic kidney disease with stage 1 through stage 4 chronic kidney disease, or unspecified chronic kidney disease: Secondary | ICD-10-CM | POA: Diagnosis not present

## 2016-01-19 DIAGNOSIS — E538 Deficiency of other specified B group vitamins: Secondary | ICD-10-CM | POA: Diagnosis not present

## 2016-01-19 DIAGNOSIS — I10 Essential (primary) hypertension: Secondary | ICD-10-CM | POA: Diagnosis not present

## 2016-01-19 DIAGNOSIS — M1991 Primary osteoarthritis, unspecified site: Secondary | ICD-10-CM | POA: Diagnosis not present

## 2016-01-19 DIAGNOSIS — M47812 Spondylosis without myelopathy or radiculopathy, cervical region: Secondary | ICD-10-CM | POA: Diagnosis not present

## 2016-01-19 DIAGNOSIS — Z9181 History of falling: Secondary | ICD-10-CM | POA: Diagnosis not present

## 2016-01-19 DIAGNOSIS — M1611 Unilateral primary osteoarthritis, right hip: Secondary | ICD-10-CM | POA: Diagnosis not present

## 2016-01-19 DIAGNOSIS — H04123 Dry eye syndrome of bilateral lacrimal glands: Secondary | ICD-10-CM | POA: Diagnosis not present

## 2016-01-19 DIAGNOSIS — N183 Chronic kidney disease, stage 3 (moderate): Secondary | ICD-10-CM | POA: Diagnosis not present

## 2016-01-19 DIAGNOSIS — J449 Chronic obstructive pulmonary disease, unspecified: Secondary | ICD-10-CM | POA: Diagnosis not present

## 2016-01-20 DIAGNOSIS — M47812 Spondylosis without myelopathy or radiculopathy, cervical region: Secondary | ICD-10-CM | POA: Diagnosis not present

## 2016-01-20 DIAGNOSIS — J449 Chronic obstructive pulmonary disease, unspecified: Secondary | ICD-10-CM | POA: Diagnosis not present

## 2016-01-20 DIAGNOSIS — I129 Hypertensive chronic kidney disease with stage 1 through stage 4 chronic kidney disease, or unspecified chronic kidney disease: Secondary | ICD-10-CM | POA: Diagnosis not present

## 2016-01-20 DIAGNOSIS — M1611 Unilateral primary osteoarthritis, right hip: Secondary | ICD-10-CM | POA: Diagnosis not present

## 2016-01-20 DIAGNOSIS — E538 Deficiency of other specified B group vitamins: Secondary | ICD-10-CM | POA: Diagnosis not present

## 2016-01-20 DIAGNOSIS — Z9181 History of falling: Secondary | ICD-10-CM | POA: Diagnosis not present

## 2016-01-20 DIAGNOSIS — N183 Chronic kidney disease, stage 3 (moderate): Secondary | ICD-10-CM | POA: Diagnosis not present

## 2016-01-21 DIAGNOSIS — J449 Chronic obstructive pulmonary disease, unspecified: Secondary | ICD-10-CM | POA: Diagnosis not present

## 2016-01-21 DIAGNOSIS — M47812 Spondylosis without myelopathy or radiculopathy, cervical region: Secondary | ICD-10-CM | POA: Diagnosis not present

## 2016-01-21 DIAGNOSIS — E538 Deficiency of other specified B group vitamins: Secondary | ICD-10-CM | POA: Diagnosis not present

## 2016-01-21 DIAGNOSIS — I129 Hypertensive chronic kidney disease with stage 1 through stage 4 chronic kidney disease, or unspecified chronic kidney disease: Secondary | ICD-10-CM | POA: Diagnosis not present

## 2016-01-21 DIAGNOSIS — N183 Chronic kidney disease, stage 3 (moderate): Secondary | ICD-10-CM | POA: Diagnosis not present

## 2016-01-21 DIAGNOSIS — Z9181 History of falling: Secondary | ICD-10-CM | POA: Diagnosis not present

## 2016-01-21 DIAGNOSIS — M1611 Unilateral primary osteoarthritis, right hip: Secondary | ICD-10-CM | POA: Diagnosis not present

## 2016-01-22 DIAGNOSIS — N183 Chronic kidney disease, stage 3 (moderate): Secondary | ICD-10-CM | POA: Diagnosis not present

## 2016-01-22 DIAGNOSIS — M47812 Spondylosis without myelopathy or radiculopathy, cervical region: Secondary | ICD-10-CM | POA: Diagnosis not present

## 2016-01-22 DIAGNOSIS — R269 Unspecified abnormalities of gait and mobility: Secondary | ICD-10-CM | POA: Diagnosis not present

## 2016-01-22 DIAGNOSIS — E538 Deficiency of other specified B group vitamins: Secondary | ICD-10-CM | POA: Diagnosis not present

## 2016-01-22 DIAGNOSIS — Z9181 History of falling: Secondary | ICD-10-CM | POA: Diagnosis not present

## 2016-01-22 DIAGNOSIS — J449 Chronic obstructive pulmonary disease, unspecified: Secondary | ICD-10-CM | POA: Diagnosis not present

## 2016-01-22 DIAGNOSIS — I129 Hypertensive chronic kidney disease with stage 1 through stage 4 chronic kidney disease, or unspecified chronic kidney disease: Secondary | ICD-10-CM | POA: Diagnosis not present

## 2016-01-22 DIAGNOSIS — M1611 Unilateral primary osteoarthritis, right hip: Secondary | ICD-10-CM | POA: Diagnosis not present

## 2016-01-25 DIAGNOSIS — M47812 Spondylosis without myelopathy or radiculopathy, cervical region: Secondary | ICD-10-CM | POA: Diagnosis not present

## 2016-01-25 DIAGNOSIS — I129 Hypertensive chronic kidney disease with stage 1 through stage 4 chronic kidney disease, or unspecified chronic kidney disease: Secondary | ICD-10-CM | POA: Diagnosis not present

## 2016-01-25 DIAGNOSIS — E538 Deficiency of other specified B group vitamins: Secondary | ICD-10-CM | POA: Diagnosis not present

## 2016-01-25 DIAGNOSIS — Z9181 History of falling: Secondary | ICD-10-CM | POA: Diagnosis not present

## 2016-01-25 DIAGNOSIS — M1611 Unilateral primary osteoarthritis, right hip: Secondary | ICD-10-CM | POA: Diagnosis not present

## 2016-01-25 DIAGNOSIS — J449 Chronic obstructive pulmonary disease, unspecified: Secondary | ICD-10-CM | POA: Diagnosis not present

## 2016-01-25 DIAGNOSIS — N183 Chronic kidney disease, stage 3 (moderate): Secondary | ICD-10-CM | POA: Diagnosis not present

## 2016-01-27 DIAGNOSIS — M47812 Spondylosis without myelopathy or radiculopathy, cervical region: Secondary | ICD-10-CM | POA: Diagnosis not present

## 2016-01-27 DIAGNOSIS — J449 Chronic obstructive pulmonary disease, unspecified: Secondary | ICD-10-CM | POA: Diagnosis not present

## 2016-01-27 DIAGNOSIS — N183 Chronic kidney disease, stage 3 (moderate): Secondary | ICD-10-CM | POA: Diagnosis not present

## 2016-01-27 DIAGNOSIS — I129 Hypertensive chronic kidney disease with stage 1 through stage 4 chronic kidney disease, or unspecified chronic kidney disease: Secondary | ICD-10-CM | POA: Diagnosis not present

## 2016-01-27 DIAGNOSIS — Z9181 History of falling: Secondary | ICD-10-CM | POA: Diagnosis not present

## 2016-01-27 DIAGNOSIS — E538 Deficiency of other specified B group vitamins: Secondary | ICD-10-CM | POA: Diagnosis not present

## 2016-01-27 DIAGNOSIS — M1611 Unilateral primary osteoarthritis, right hip: Secondary | ICD-10-CM | POA: Diagnosis not present

## 2016-01-28 DIAGNOSIS — Z9181 History of falling: Secondary | ICD-10-CM | POA: Diagnosis not present

## 2016-01-28 DIAGNOSIS — I129 Hypertensive chronic kidney disease with stage 1 through stage 4 chronic kidney disease, or unspecified chronic kidney disease: Secondary | ICD-10-CM | POA: Diagnosis not present

## 2016-01-28 DIAGNOSIS — M1611 Unilateral primary osteoarthritis, right hip: Secondary | ICD-10-CM | POA: Diagnosis not present

## 2016-01-28 DIAGNOSIS — J449 Chronic obstructive pulmonary disease, unspecified: Secondary | ICD-10-CM | POA: Diagnosis not present

## 2016-01-28 DIAGNOSIS — N183 Chronic kidney disease, stage 3 (moderate): Secondary | ICD-10-CM | POA: Diagnosis not present

## 2016-01-28 DIAGNOSIS — M47812 Spondylosis without myelopathy or radiculopathy, cervical region: Secondary | ICD-10-CM | POA: Diagnosis not present

## 2016-01-28 DIAGNOSIS — E538 Deficiency of other specified B group vitamins: Secondary | ICD-10-CM | POA: Diagnosis not present

## 2016-01-29 DIAGNOSIS — I10 Essential (primary) hypertension: Secondary | ICD-10-CM | POA: Diagnosis not present

## 2016-01-29 DIAGNOSIS — M47812 Spondylosis without myelopathy or radiculopathy, cervical region: Secondary | ICD-10-CM | POA: Diagnosis not present

## 2016-01-29 DIAGNOSIS — M1388 Other specified arthritis, other site: Secondary | ICD-10-CM | POA: Diagnosis not present

## 2016-01-29 DIAGNOSIS — Z9181 History of falling: Secondary | ICD-10-CM | POA: Diagnosis not present

## 2016-01-29 DIAGNOSIS — N183 Chronic kidney disease, stage 3 (moderate): Secondary | ICD-10-CM | POA: Diagnosis not present

## 2016-01-29 DIAGNOSIS — E538 Deficiency of other specified B group vitamins: Secondary | ICD-10-CM | POA: Diagnosis not present

## 2016-01-29 DIAGNOSIS — J449 Chronic obstructive pulmonary disease, unspecified: Secondary | ICD-10-CM | POA: Diagnosis not present

## 2016-01-29 DIAGNOSIS — I129 Hypertensive chronic kidney disease with stage 1 through stage 4 chronic kidney disease, or unspecified chronic kidney disease: Secondary | ICD-10-CM | POA: Diagnosis not present

## 2016-01-29 DIAGNOSIS — M1611 Unilateral primary osteoarthritis, right hip: Secondary | ICD-10-CM | POA: Diagnosis not present

## 2016-02-01 DIAGNOSIS — N183 Chronic kidney disease, stage 3 (moderate): Secondary | ICD-10-CM | POA: Diagnosis not present

## 2016-02-01 DIAGNOSIS — M47812 Spondylosis without myelopathy or radiculopathy, cervical region: Secondary | ICD-10-CM | POA: Diagnosis not present

## 2016-02-01 DIAGNOSIS — M1611 Unilateral primary osteoarthritis, right hip: Secondary | ICD-10-CM | POA: Diagnosis not present

## 2016-02-01 DIAGNOSIS — I129 Hypertensive chronic kidney disease with stage 1 through stage 4 chronic kidney disease, or unspecified chronic kidney disease: Secondary | ICD-10-CM | POA: Diagnosis not present

## 2016-02-01 DIAGNOSIS — E538 Deficiency of other specified B group vitamins: Secondary | ICD-10-CM | POA: Diagnosis not present

## 2016-02-01 DIAGNOSIS — J449 Chronic obstructive pulmonary disease, unspecified: Secondary | ICD-10-CM | POA: Diagnosis not present

## 2016-02-01 DIAGNOSIS — Z9181 History of falling: Secondary | ICD-10-CM | POA: Diagnosis not present

## 2016-02-02 DIAGNOSIS — J449 Chronic obstructive pulmonary disease, unspecified: Secondary | ICD-10-CM | POA: Diagnosis not present

## 2016-02-02 DIAGNOSIS — M47812 Spondylosis without myelopathy or radiculopathy, cervical region: Secondary | ICD-10-CM | POA: Diagnosis not present

## 2016-02-02 DIAGNOSIS — N183 Chronic kidney disease, stage 3 (moderate): Secondary | ICD-10-CM | POA: Diagnosis not present

## 2016-02-02 DIAGNOSIS — Z9181 History of falling: Secondary | ICD-10-CM | POA: Diagnosis not present

## 2016-02-02 DIAGNOSIS — I129 Hypertensive chronic kidney disease with stage 1 through stage 4 chronic kidney disease, or unspecified chronic kidney disease: Secondary | ICD-10-CM | POA: Diagnosis not present

## 2016-02-02 DIAGNOSIS — M1611 Unilateral primary osteoarthritis, right hip: Secondary | ICD-10-CM | POA: Diagnosis not present

## 2016-02-02 DIAGNOSIS — E538 Deficiency of other specified B group vitamins: Secondary | ICD-10-CM | POA: Diagnosis not present

## 2016-02-03 DIAGNOSIS — I129 Hypertensive chronic kidney disease with stage 1 through stage 4 chronic kidney disease, or unspecified chronic kidney disease: Secondary | ICD-10-CM | POA: Diagnosis not present

## 2016-02-03 DIAGNOSIS — N183 Chronic kidney disease, stage 3 (moderate): Secondary | ICD-10-CM | POA: Diagnosis not present

## 2016-02-03 DIAGNOSIS — J449 Chronic obstructive pulmonary disease, unspecified: Secondary | ICD-10-CM | POA: Diagnosis not present

## 2016-02-03 DIAGNOSIS — M47812 Spondylosis without myelopathy or radiculopathy, cervical region: Secondary | ICD-10-CM | POA: Diagnosis not present

## 2016-02-03 DIAGNOSIS — Z9181 History of falling: Secondary | ICD-10-CM | POA: Diagnosis not present

## 2016-02-03 DIAGNOSIS — E538 Deficiency of other specified B group vitamins: Secondary | ICD-10-CM | POA: Diagnosis not present

## 2016-02-03 DIAGNOSIS — M1611 Unilateral primary osteoarthritis, right hip: Secondary | ICD-10-CM | POA: Diagnosis not present

## 2016-02-05 DIAGNOSIS — Z9181 History of falling: Secondary | ICD-10-CM | POA: Diagnosis not present

## 2016-02-05 DIAGNOSIS — M1611 Unilateral primary osteoarthritis, right hip: Secondary | ICD-10-CM | POA: Diagnosis not present

## 2016-02-05 DIAGNOSIS — I129 Hypertensive chronic kidney disease with stage 1 through stage 4 chronic kidney disease, or unspecified chronic kidney disease: Secondary | ICD-10-CM | POA: Diagnosis not present

## 2016-02-05 DIAGNOSIS — N183 Chronic kidney disease, stage 3 (moderate): Secondary | ICD-10-CM | POA: Diagnosis not present

## 2016-02-05 DIAGNOSIS — M47812 Spondylosis without myelopathy or radiculopathy, cervical region: Secondary | ICD-10-CM | POA: Diagnosis not present

## 2016-02-05 DIAGNOSIS — J449 Chronic obstructive pulmonary disease, unspecified: Secondary | ICD-10-CM | POA: Diagnosis not present

## 2016-02-05 DIAGNOSIS — E538 Deficiency of other specified B group vitamins: Secondary | ICD-10-CM | POA: Diagnosis not present

## 2016-02-09 DIAGNOSIS — N183 Chronic kidney disease, stage 3 (moderate): Secondary | ICD-10-CM | POA: Diagnosis not present

## 2016-02-09 DIAGNOSIS — M47812 Spondylosis without myelopathy or radiculopathy, cervical region: Secondary | ICD-10-CM | POA: Diagnosis not present

## 2016-02-09 DIAGNOSIS — J449 Chronic obstructive pulmonary disease, unspecified: Secondary | ICD-10-CM | POA: Diagnosis not present

## 2016-02-09 DIAGNOSIS — Z9181 History of falling: Secondary | ICD-10-CM | POA: Diagnosis not present

## 2016-02-09 DIAGNOSIS — M1611 Unilateral primary osteoarthritis, right hip: Secondary | ICD-10-CM | POA: Diagnosis not present

## 2016-02-09 DIAGNOSIS — E538 Deficiency of other specified B group vitamins: Secondary | ICD-10-CM | POA: Diagnosis not present

## 2016-02-09 DIAGNOSIS — I129 Hypertensive chronic kidney disease with stage 1 through stage 4 chronic kidney disease, or unspecified chronic kidney disease: Secondary | ICD-10-CM | POA: Diagnosis not present

## 2016-02-11 DIAGNOSIS — M47812 Spondylosis without myelopathy or radiculopathy, cervical region: Secondary | ICD-10-CM | POA: Diagnosis not present

## 2016-02-11 DIAGNOSIS — I129 Hypertensive chronic kidney disease with stage 1 through stage 4 chronic kidney disease, or unspecified chronic kidney disease: Secondary | ICD-10-CM | POA: Diagnosis not present

## 2016-02-11 DIAGNOSIS — M1611 Unilateral primary osteoarthritis, right hip: Secondary | ICD-10-CM | POA: Diagnosis not present

## 2016-02-11 DIAGNOSIS — E538 Deficiency of other specified B group vitamins: Secondary | ICD-10-CM | POA: Diagnosis not present

## 2016-02-11 DIAGNOSIS — Z9181 History of falling: Secondary | ICD-10-CM | POA: Diagnosis not present

## 2016-02-11 DIAGNOSIS — J449 Chronic obstructive pulmonary disease, unspecified: Secondary | ICD-10-CM | POA: Diagnosis not present

## 2016-02-11 DIAGNOSIS — N183 Chronic kidney disease, stage 3 (moderate): Secondary | ICD-10-CM | POA: Diagnosis not present

## 2016-02-15 DIAGNOSIS — Z9181 History of falling: Secondary | ICD-10-CM | POA: Diagnosis not present

## 2016-02-15 DIAGNOSIS — I129 Hypertensive chronic kidney disease with stage 1 through stage 4 chronic kidney disease, or unspecified chronic kidney disease: Secondary | ICD-10-CM | POA: Diagnosis not present

## 2016-02-15 DIAGNOSIS — J449 Chronic obstructive pulmonary disease, unspecified: Secondary | ICD-10-CM | POA: Diagnosis not present

## 2016-02-15 DIAGNOSIS — M47812 Spondylosis without myelopathy or radiculopathy, cervical region: Secondary | ICD-10-CM | POA: Diagnosis not present

## 2016-02-15 DIAGNOSIS — M1611 Unilateral primary osteoarthritis, right hip: Secondary | ICD-10-CM | POA: Diagnosis not present

## 2016-02-15 DIAGNOSIS — N183 Chronic kidney disease, stage 3 (moderate): Secondary | ICD-10-CM | POA: Diagnosis not present

## 2016-02-15 DIAGNOSIS — E538 Deficiency of other specified B group vitamins: Secondary | ICD-10-CM | POA: Diagnosis not present

## 2016-02-23 DIAGNOSIS — G8929 Other chronic pain: Secondary | ICD-10-CM | POA: Diagnosis not present

## 2016-02-24 DIAGNOSIS — N183 Chronic kidney disease, stage 3 (moderate): Secondary | ICD-10-CM | POA: Diagnosis not present

## 2016-02-24 DIAGNOSIS — M1611 Unilateral primary osteoarthritis, right hip: Secondary | ICD-10-CM | POA: Diagnosis not present

## 2016-02-24 DIAGNOSIS — I129 Hypertensive chronic kidney disease with stage 1 through stage 4 chronic kidney disease, or unspecified chronic kidney disease: Secondary | ICD-10-CM | POA: Diagnosis not present

## 2016-02-24 DIAGNOSIS — E538 Deficiency of other specified B group vitamins: Secondary | ICD-10-CM | POA: Diagnosis not present

## 2016-02-24 DIAGNOSIS — Z9181 History of falling: Secondary | ICD-10-CM | POA: Diagnosis not present

## 2016-02-24 DIAGNOSIS — J449 Chronic obstructive pulmonary disease, unspecified: Secondary | ICD-10-CM | POA: Diagnosis not present

## 2016-02-24 DIAGNOSIS — M47812 Spondylosis without myelopathy or radiculopathy, cervical region: Secondary | ICD-10-CM | POA: Diagnosis not present

## 2016-02-26 DIAGNOSIS — G8929 Other chronic pain: Secondary | ICD-10-CM | POA: Diagnosis not present

## 2016-02-26 DIAGNOSIS — R6889 Other general symptoms and signs: Secondary | ICD-10-CM | POA: Diagnosis not present

## 2016-03-01 DIAGNOSIS — R52 Pain, unspecified: Secondary | ICD-10-CM | POA: Diagnosis not present

## 2016-03-01 DIAGNOSIS — R6889 Other general symptoms and signs: Secondary | ICD-10-CM | POA: Diagnosis not present

## 2016-03-01 DIAGNOSIS — G478 Other sleep disorders: Secondary | ICD-10-CM | POA: Diagnosis not present

## 2016-03-08 DIAGNOSIS — G8929 Other chronic pain: Secondary | ICD-10-CM | POA: Diagnosis not present

## 2016-03-15 DIAGNOSIS — G8929 Other chronic pain: Secondary | ICD-10-CM | POA: Diagnosis not present

## 2016-03-15 DIAGNOSIS — G6289 Other specified polyneuropathies: Secondary | ICD-10-CM | POA: Diagnosis not present

## 2016-03-29 ENCOUNTER — Ambulatory Visit: Payer: Medicare Other | Admitting: Family Medicine

## 2016-04-05 DIAGNOSIS — M1388 Other specified arthritis, other site: Secondary | ICD-10-CM | POA: Diagnosis not present

## 2016-04-05 DIAGNOSIS — G6289 Other specified polyneuropathies: Secondary | ICD-10-CM | POA: Diagnosis not present

## 2016-04-05 DIAGNOSIS — I10 Essential (primary) hypertension: Secondary | ICD-10-CM | POA: Diagnosis not present

## 2016-04-07 DIAGNOSIS — M79672 Pain in left foot: Secondary | ICD-10-CM | POA: Diagnosis not present

## 2016-04-07 DIAGNOSIS — M25572 Pain in left ankle and joints of left foot: Secondary | ICD-10-CM | POA: Diagnosis not present

## 2016-04-12 DIAGNOSIS — M1711 Unilateral primary osteoarthritis, right knee: Secondary | ICD-10-CM | POA: Diagnosis not present

## 2016-04-12 DIAGNOSIS — M25561 Pain in right knee: Secondary | ICD-10-CM | POA: Diagnosis not present

## 2016-04-21 DIAGNOSIS — G4700 Insomnia, unspecified: Secondary | ICD-10-CM | POA: Diagnosis not present

## 2016-05-17 DIAGNOSIS — G6289 Other specified polyneuropathies: Secondary | ICD-10-CM | POA: Diagnosis not present

## 2016-05-17 DIAGNOSIS — I10 Essential (primary) hypertension: Secondary | ICD-10-CM | POA: Diagnosis not present

## 2016-06-07 DIAGNOSIS — G6289 Other specified polyneuropathies: Secondary | ICD-10-CM | POA: Diagnosis not present

## 2016-06-21 DIAGNOSIS — M1711 Unilateral primary osteoarthritis, right knee: Secondary | ICD-10-CM | POA: Diagnosis not present

## 2016-10-15 IMAGING — MR MR CERVICAL SPINE W/O CM
5 series · 33 of 48 positions shown · non-contrast
Comparison: 09/15/2014 MR.

CLINICAL DATA: [AGE] female fell January 2015. Neck pain has
become worse since fall. Subsequent encounter.

EXAM:
MRI CERVICAL SPINE WITHOUT CONTRAST
TECHNIQUE: Multiplanar, multisequence MR imaging of the cervical spine was
performed. No intravenous contrast was administered.

[Series 3: T2 · sagittal · 3.0mm · 0.56mm/px · 8 of 13 slices shown (1 of 2)]
[im 1/13]
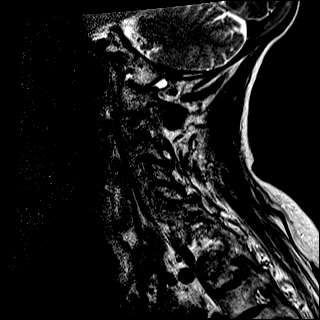
[im 2/13]
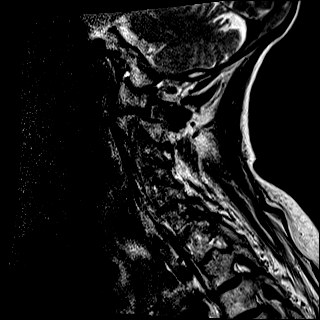
[im 4/13]
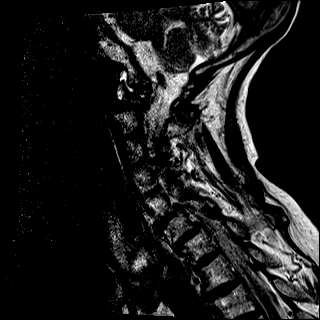
[im 6/13]
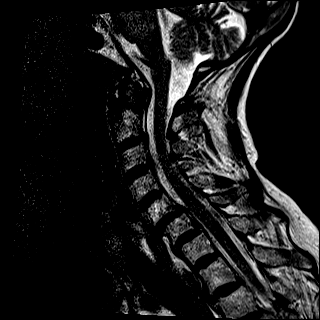
[im 7/13]
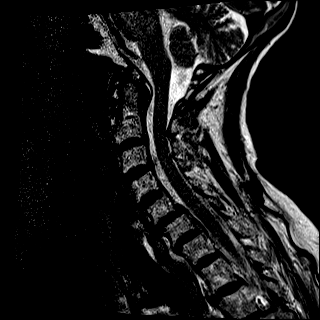
[im 9/13]
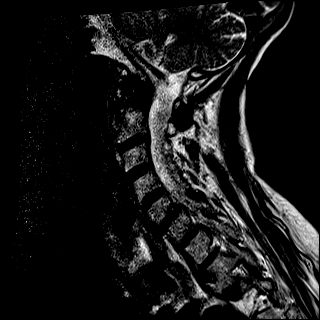
[im 11/13]
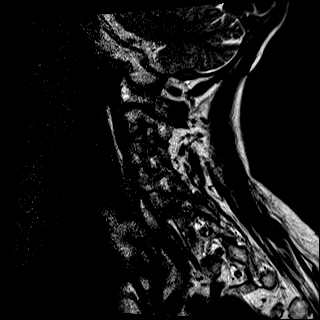
[im 13/13]
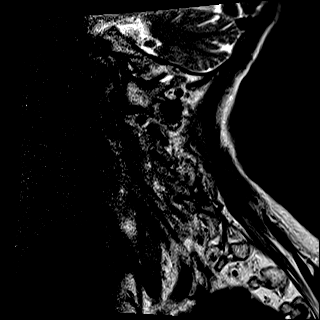

[Series 4: T1 · sagittal · 3.0mm · 0.56mm/px · 7 of 13 slices shown]
[im 1/13]
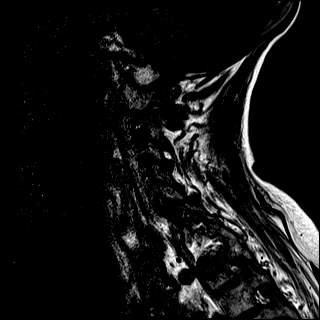
[im 3/13]
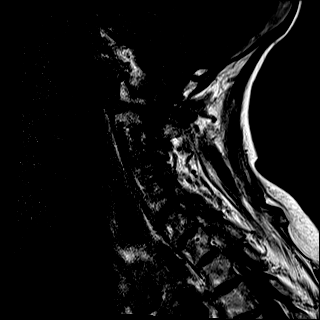
[im 5/13]
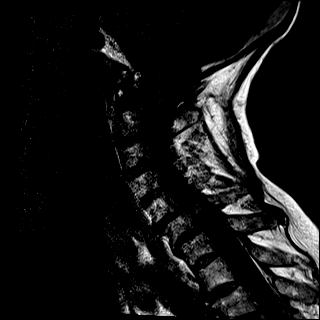
[im 7/13]
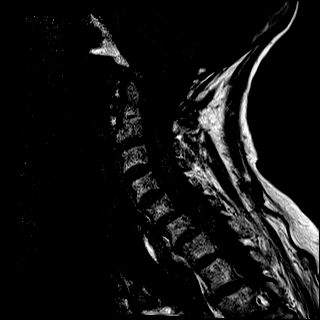
[im 9/13]
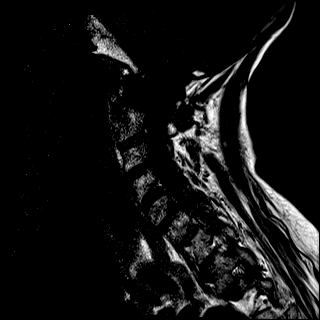
[im 11/13]
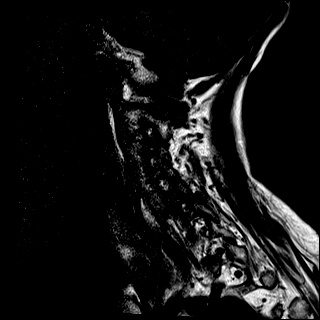
[im 13/13]
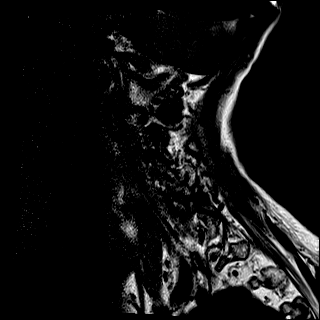

[Series 5: STIR · sagittal · 3.0mm · 0.35mm/px · 7 of 13 slices shown]
[im 1/13]
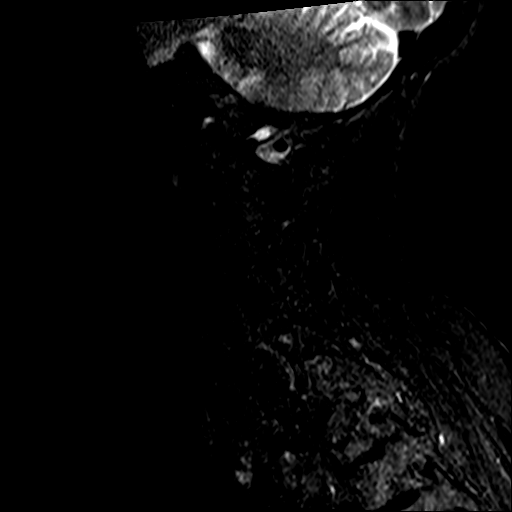
[im 3/13]
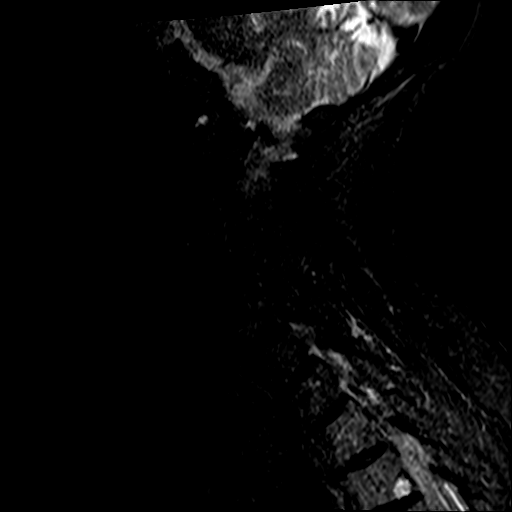
[im 5/13]
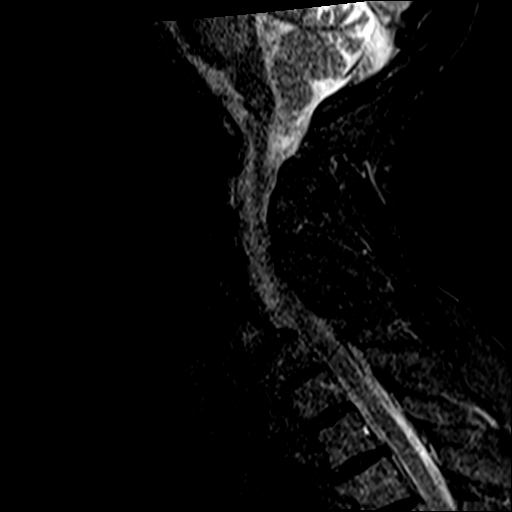
[im 7/13]
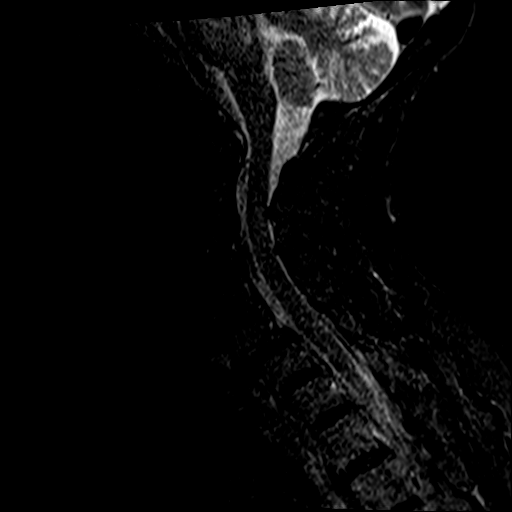
[im 9/13]
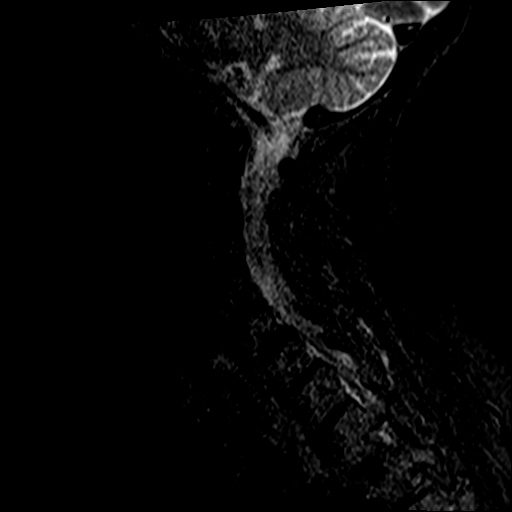
[im 11/13]
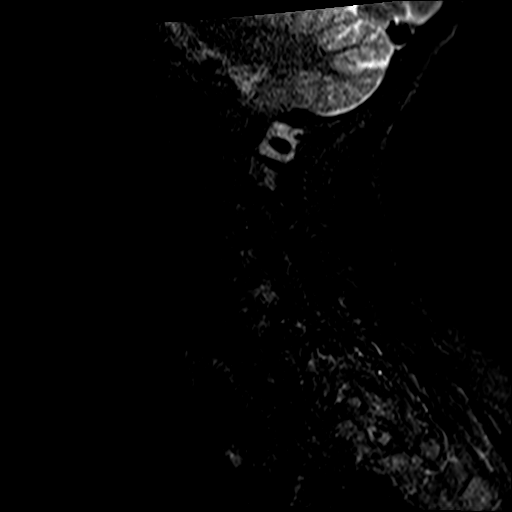
[im 13/13]
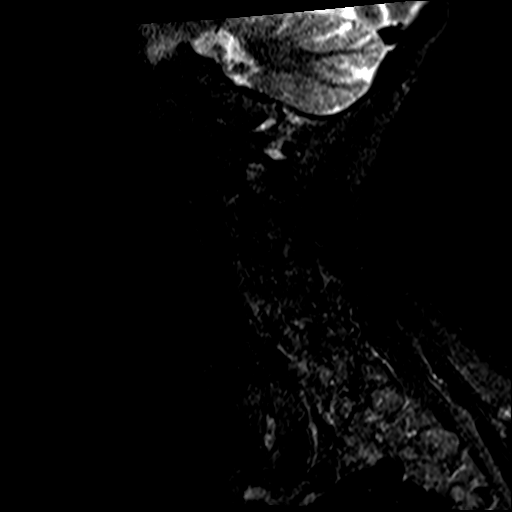

[Series 6: T2 · oblique · 3.0mm · 0.66mm/px · 9 of 25 slices shown (2 of 2)]
[im 1/25]
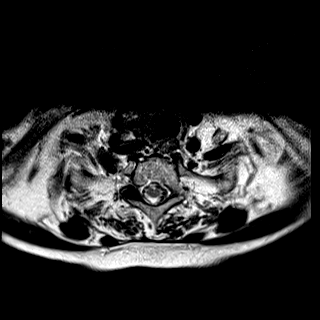
[im 5/25]
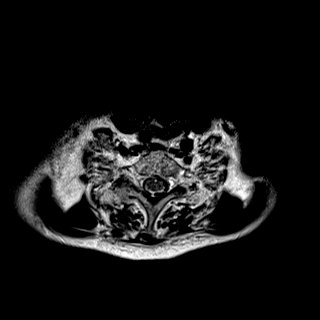
[im 9/25]
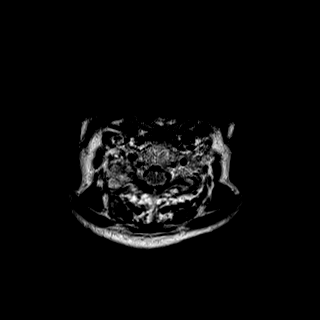
[im 11/25]
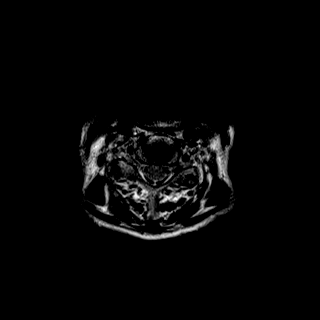
[im 13/25]
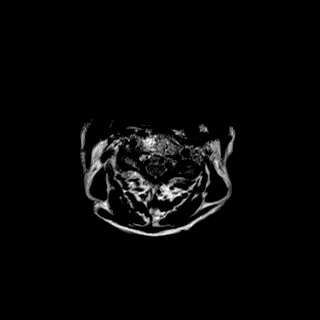
[im 15/25]
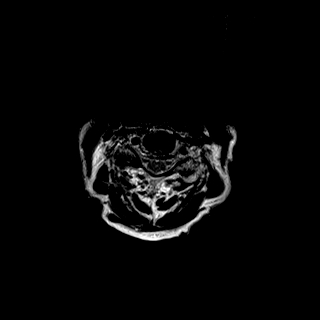
[im 17/25]
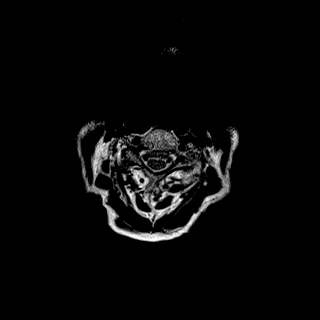
[im 21/25]
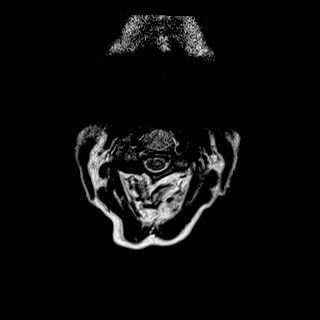
[im 25/25]
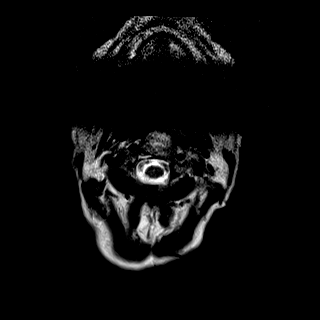

[Series 7: mpgr ax · oblique · 3.0mm · 0.35mm/px · 2 of 25 slices shown]
[im 1/25]
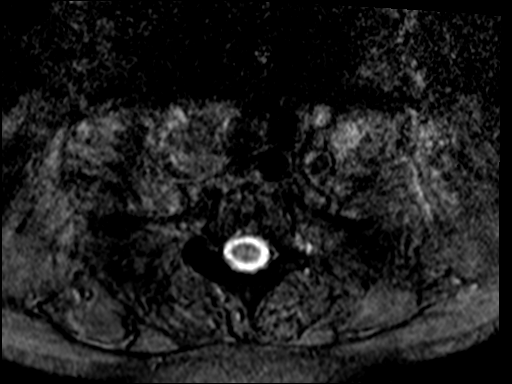
[im 5/25]
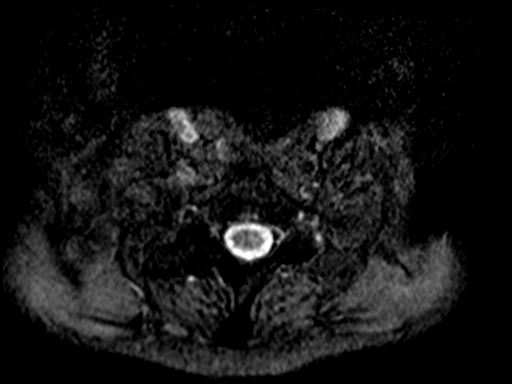

[33 of 48 positions shown; findings below may reference images not displayed]

FINDINGS: Secondary to patient's habitus and discomfort, cervical spine coil
was not able to be utilized and therefore images are slightly
suboptimal.

No soft tissue edema or osseous edema to suggest acute soft tissue
or osseous injury. The degree of anterior slip of C5 (4 mm) and
anterior slip of C6 (3.2 mm) is similar to the prior exam felt to be
related to facet degenerative changes without fracture identified.
If fracture were of high clinical concern, CT may then be considered
as MR Habebt not detect subtle fractures.

Visualized paravertebral structures unremarkable. Vertebral arteries
and carotid arteries are patent.

C2-3: Very mild foraminal narrowing bilaterally. Buckling posterior
ligaments. No significant spinal stenosis.

C3-4: Right uncinate hypertrophy and facet degenerative changes with
moderate to marked right foraminal narrowing.

C4-5:  Very mild right foraminal narrowing.

C5-6: Facet degenerative changes. 4 mm anterior slip of C5. Bulge
with mild cephalad extension. Mild spinal stenosis. No cord
compression. Uncinate hypertrophy. Right greater than left moderate
foraminal narrowing.

C6-7: Bulge with cephalad extension. Elevation posterior
longitudinal ligament. 3.2 mm anterior slip of C6. Narrowing ventral
aspect of thecal sac. No cord compression. Uncinate hypertrophy with
mild to moderate right-sided and mild left-sided foraminal
narrowing.

C7-T1:  Negative.

T1-2: Bulge. Slight narrowing ventral aspect of thecal sac without
cord compression.

Inferior right T2 vertebral body bony lesion probably degenerative
in origin without significant change.
IMPRESSION: Minimal change since 09/15/2014 with slight increase in degree of
ventral thecal sac narrowing at the C5-6 and C6-7 level but without
cord compression. Remainder of findings are relatively similar as
detailed above.

## 2017-04-24 DEATH — deceased
